# Patient Record
Sex: Female | Born: 1990 | Race: Black or African American | Hispanic: No | Marital: Single | Smoking: Former smoker
Health system: Southern US, Community
[De-identification: ages and names within clinical notes are randomized; demographics above are authoritative.]

## PROBLEM LIST (undated history)

## (undated) ENCOUNTER — Inpatient Hospital Stay (HOSPITAL_COMMUNITY): Payer: Self-pay

## (undated) DIAGNOSIS — Z8619 Personal history of other infectious and parasitic diseases: Secondary | ICD-10-CM

## (undated) DIAGNOSIS — B009 Herpesviral infection, unspecified: Secondary | ICD-10-CM

## (undated) HISTORY — DX: Personal history of other infectious and parasitic diseases: Z86.19

---
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
## 2010-02-08 ENCOUNTER — Ambulatory Visit: Payer: Self-pay | Admitting: Family Medicine

## 2010-02-08 LAB — CONVERTED CEMR LAB: Chlamydia, DNA Probe: NEGATIVE

## 2010-02-13 ENCOUNTER — Encounter: Payer: Self-pay | Admitting: Family Medicine
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
## 2010-05-29 NOTE — Letter (Signed)
Summary: LAB Letter  [facility]  [address]   [place], [place] [address]   Phone: [phone]  Fax: [phone]    02/13/2010  AEVA POSEY [address] [place], [place]  [address]  Dear Ms. FABRY,   Cervical cultures were negative. Good news!        Sincerely,   Denny Levy MD  Appended Document: LAB Letter mailed  Appended Document: LAB Letter letter returned, unable to forward. ERIN ODELL 02/28/10
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
## 2010-05-29 NOTE — Assessment & Plan Note (Signed)
Summary: resch'd from 10/6 for iud check/bmc   Vital Signs:  Patient profile:   20 year old female Height:      62 inches Weight:      139.4 pounds BMI:     25.59 Temp:     98.4 degrees F oral Pulse rate:   100 / minute BP sitting:   127 / 98  (left arm) Cuff size:   regular  Vitals Entered By: Garen Grams LPN (February 08, 2010 11:26 AM) CC: Check IUD, std check Is Patient Diabetic? No Pain Assessment Patient in pain? no        Primary Care Provider:  Lequita Asal  MD  CC:  Check IUD and std check.  History of Present Illness: d/c that is not itchy, smelly. last seveal days wants IUD check  Habits & Providers  Alcohol-Tobacco-Diet     Tobacco Status: current     Tobacco Counseling: to quit use of tobacco products     Cigarette Packs/Day: <0.25  Current Medications (verified): 1)  Ibuprofen 600 Mg Tabs (Ibuprofen) .... One Tab By Mouth Q6 As Needed Pain 2)  Triamcinolone Acetonide 0.1 % Oint (Triamcinolone Acetonide) .... Apply To Affected Area of Neck Two Times A Day Until Resolved. Disp 60 G Tube. 3)  Citalopram Hydrobromide 20 Mg Tabs (Citalopram Hydrobromide) .... One Tab By Mouth Daily 4)  Metronidazole 500 Mg Tabs (Metronidazole) .Marland Kitchen.. 1 By Mouth Two Times A Day  Allergies: No Known Drug Allergies  Past History:  Past Medical History: Last updated: 07/08/2008 h/o chlamydia GBS + abortion anemia tobacco abuse  Past Surgical History: Last updated: 07/08/2008 C section x2 (2006, 2007)  Review of Systems  The patient denies fever and abdominal pain.    Physical Exam  General:  alert and well-developed.   Genitalia:  normal introitus, no external lesions, and no vaginal discharge.  thin white d/c IUD strings are seen peeping slightkly from os--short but present   Impression & Recommendations:  Problem # 1:  VAGINITIS, BACTERIAL, RECURRENT (ICD-616.10)  Her updated medication list for this problem includes:    Metronidazole 500 Mg Tabs  (Metronidazole) .Marland Kitchen... 1 by mouth two times a day GC / Chlamydia p IUD strings seen (they are short but present)  Orders: [facility]- Est Level  3 ([address])  Complete Medication List: 1)  Ibuprofen 600 Mg Tabs (Ibuprofen) .... One tab by mouth q6 as needed pain 2)  Triamcinolone Acetonide 0.1 % Oint (Triamcinolone acetonide) .... Apply to affected area of neck two times a day until resolved. disp 60 g tube. 3)  Citalopram Hydrobromide 20 Mg Tabs (Citalopram hydrobromide) .... One tab by mouth daily 4)  Metronidazole 500 Mg Tabs (Metronidazole) .Marland Kitchen.. 1 by mouth two times a day  Other Orders: GC/Chlamydia-[facility] (87591/[address]) [facility] Prescriptions: METRONIDAZOLE 500 MG TABS (METRONIDAZOLE) 1 by mouth two times a day  #14 x 2   Entered and Authorized by:   Denny Levy MD   Signed by:   Denny Levy MD on 02/08/2010   Method used:   Electronically to        [facility] [address] [phone]* (retail)       [address]       [place], [place]  [address]       Ph: [phone]       Fax: [phone]   RxID:   [id number]   Laboratory Results  Date/Time Received: February 08, 2010 11:57 AM  Date/Time Reported: February 08, 2010 12:02  PM   Wet [place] Source: vag WBC/hpf: 10-20 Bacteria/hpf: 2+  Cocci Clue cells/hpf: moderate  Positive whiff Yeast/hpf: none Trichomonas/hpf: none Comments: ...............test performed by......Marland KitchenBonnie A. [place]az[place], MLS (ASCP)cm

## 2010-05-29 NOTE — Assessment & Plan Note (Signed)
Summary: depression,tcb   Vital Signs:  Patient profile:   20 year old female Height:      62 inches Weight:      138 pounds BMI:     25.33 Temp:     98.7 degrees F Pulse rate:   75 / minute BP sitting:   97 / 63  (right arm)  Vitals Entered By: Dennison Nancy RN (August 14, 2009 2:30 PM) CC: Depression Is Patient Diabetic? No Pain Assessment Patient in pain? no        Primary Care Provider:  Lequita Asal  MD  CC:  Depression.  History of Present Illness: 20 y/o female here c/o depression  depression- patient's therapist present. endorses depressive symptoms intermittently since birth of last son 09/2008. noted at postpartum visit, but were gone at that time. +crying spells, hypersomnolence, suicidal ideation without plan. no homicidal ideation. patient does contract for safety. denies problems with appetite. some anhedonia. seeing therapist at least 2x weekly through [facility]. increased stressors (son with new enuresis, youngest son now with FOB's family in [place]). patient has been missing some school 2/2 mood.   Habits & Providers  Alcohol-Tobacco-Diet     Tobacco Status: current     Tobacco Counseling: to quit use of tobacco products     Cigarette Packs/Day: <0.25  Allergies (verified): No Known Drug Allergies  Family History: father- heart disease parents- diabetes mother- substance use no other psych illness  Social History: Packs/Day:  <0.25  Physical Exam  General:  overweight female, NAD. vitals reviewed.  Psych:  Oriented X3, normally interactive, good eye contact, not anxious appearing, and not depressed appearing.     Impression & Recommendations:  Problem # 1:  DEPRESSION, MAJOR (ICD-296.20) Assessment New PHQ9- 20 patient to continue therapy. antidepressant medication tx discussed, and patient would like to pursue. potential side effects, including possibility of worsening depression or suicidal thoughts discussed. will start citalopram  20 mg by mouth daily. see patient  in 7-10 days to eval for side effects then in 4-6 weeks to decide if dose titration necessary. patient also encouraged to start exercising as that may help as well. patient again contracts for safety, and will notify PCP or therapist if thoughts of harming herself increase.   Orders: [facility]- Est Level  3 ([address])  Patient Instructions: 1)  Please schedule follow up in 7-10 days AND 4-6 weeks from NOW.  2)  Please schedule son LAPRECIOUS AUSTILL for well child check at same time as mom.  Prescriptions: CITALOPRAM HYDROBROMIDE 20 MG TABS (CITALOPRAM HYDROBROMIDE) one tab by mouth daily  #31 x 3   Entered and Authorized by:   Lequita Asal  MD   Signed by:   Lequita Asal  MD on 08/14/2009   Method used:   Electronically to        [facility] [address] [phone]* (retail)       [address]       [place], [place]  [address]       Ph: [phone]       Fax: [phone]   RxID:   [id number]    Vital Signs:  Patient profile:   20 year old female Height:      62 inches Weight:      138 pounds BMI:     25.33 Temp:     98.7 degrees F Pulse rate:   75 / minute BP sitting:   97 / 63  (right arm)  Vitals Entered By: Olegario Messier  Malen Gauze RN (August 14, 2009 2:30 PM)

## 2010-05-31 ENCOUNTER — Ambulatory Visit: Admit: 2010-05-31 | Payer: Self-pay

## 2010-05-31 ENCOUNTER — Ambulatory Visit: Payer: Self-pay | Admitting: Family Medicine

## 2010-06-05 ENCOUNTER — Encounter: Payer: Self-pay | Admitting: Family Medicine

## 2010-06-05 ENCOUNTER — Ambulatory Visit (INDEPENDENT_AMBULATORY_CARE_PROVIDER_SITE_OTHER): Payer: Medicaid Other | Admitting: Family Medicine

## 2010-06-05 DIAGNOSIS — L03119 Cellulitis of unspecified part of limb: Secondary | ICD-10-CM

## 2010-06-06 ENCOUNTER — Telehealth: Payer: Self-pay | Admitting: Family Medicine

## 2010-06-06 NOTE — Telephone Encounter (Signed)
Dermatology referral 

## 2010-06-07 ENCOUNTER — Encounter: Payer: Self-pay | Admitting: *Deleted

## 2010-06-14 NOTE — Assessment & Plan Note (Signed)
Summary: eo   Vital Signs:  Patient profile:   20 year old female Height:      62 inches Weight:      146.6 pounds BMI:     26.91 Temp:     98.6 degrees F oral Pulse rate:   89 / minute BP sitting:   115 / 68  (left arm) Cuff size:   regular  Vitals Entered By: Garen Grams LPN (June 05, 2010 10:02 AM) CC: STD check/boil on inner thigh. Is Patient Diabetic? No Pain Assessment Patient in pain? no        Primary Provider:  Lequita Asal  MD  CC:  STD check/boil on inner thigh..  History of Present Illness: 1. 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  She has a healing left inner thigh abscess with a healing ridge. Non-tender, no erythema, no warmth, no discharge, no fluctuance. She gets these periodically. I advised her to keep the area clean and dry and not to use a razor to shave the area, but to use a trimmer.  2. "Red bumps on perineum" she was concerned about STD causing "red bumps." what she pointed out was actually another soft tissue abscess. She had a small draining pustule. She was advised that this was the same bacterial process.  3. Concern for STD She originally was concerned for STD because of the "bumps" but after evidence of soft tissue, she decided against a vaginal exam and swab for GC/Chlamydia/Herpes/wet prep. This was offered to her.  Preventive Screening-Counseling & Management  Alcohol-Tobacco     Smoking Status: current     Packs/Day: <0.25     Tobacco Counseling: to quit use of tobacco products  Allergies: No Known Drug Allergies  Review of Systems       see HPI  Physical Exam  Skin:  left inner thigh 2x1 healing rdige and scarring from old drained abscess. small 0.5/0.5 cm pustule in the mons region.   Impression & Recommendations:  Problem # 1:  ABSCESS, INNER THIGH (ICD-682.6) She was advised that antibiotics were a temporizing solution to help this episode clear, but that these  abscesses come back frequently and good hygiene and not shaving can help.  The following medications were removed from the medication list:    Metronidazole 500 Mg Tabs (Metronidazole) .Marland Kitchen... 1 by mouth two times a day Her updated medication list for this problem includes:    Bactrim Ds 800-160 Mg Tabs (Sulfamethoxazole-trimethoprim) .Marland Kitchen... Take one tab every 12 hours by mouth for 5 days  Orders: [facility]- Est Level  3 ([address])  Complete Medication List: 1)  Ibuprofen 600 Mg Tabs (Ibuprofen) .... One tab by mouth q6 as needed pain 2)  Triamcinolone Acetonide 0.1 % Oint (Triamcinolone acetonide) .... Apply to affected area of neck two times a day until resolved. disp 60 g tube. 3)  Citalopram Hydrobromide 20 Mg Tabs (Citalopram hydrobromide) .... One tab by mouth daily 4)  Bactrim Ds 800-160 Mg Tabs (Sulfamethoxazole-trimethoprim) .... Take one tab every 12 hours by mouth  Patient Instructions: 1)  Please schedule a follow-up appointment as needed. Prescriptions: BACTRIM DS 800-160 MG TABS (SULFAMETHOXAZOLE-TRIMETHOPRIM) take one tab every 12 hours by mouth  #10 x 0   Entered and Authorized by:   Edd Arbour   Signed by:   Edd Arbour on 06/05/2010   Method used:   Electronically to        [facility] [address] [phone]* (retail)  [address]       [place], [place]  [address]       Ph: [phone]       Fax: [phone]   RxID:   [id number]    Orders Added: 1)  [facility]- Est Level  3 [[address]]

## 2010-08-05 LAB — CBC
HCT: 29 % — ABNORMAL LOW (ref 36.0–46.0)
Hemoglobin: 9.5 g/dL — ABNORMAL LOW (ref 12.0–15.0)
MCV: 77.3 fL — ABNORMAL LOW (ref 78.0–100.0)
Platelets: 276 10*3/uL (ref 150–400)
RDW: 17.1 % — ABNORMAL HIGH (ref 11.5–15.5)
WBC: 9.1 10*3/uL (ref 4.0–10.5)

## 2010-08-06 LAB — CBC
HCT: 25.1 % — ABNORMAL LOW (ref 36.0–49.0)
Hemoglobin: 8.3 g/dL — ABNORMAL LOW (ref 12.0–16.0)
Hemoglobin: 8.9 g/dL — ABNORMAL LOW (ref 12.0–16.0)
Platelets: 172 10*3/uL (ref 150–400)
Platelets: 171 10*3/uL (ref 150–400)
RBC: 3.24 MIL/uL — ABNORMAL LOW (ref 3.80–5.70)
RBC: 3.51 MIL/uL — ABNORMAL LOW (ref 3.80–5.70)
RDW: 15.4 % (ref 11.4–15.5)
WBC: 10.4 10*3/uL (ref 4.5–13.5)
WBC: 8.3 10*3/uL (ref 4.5–13.5)

## 2010-08-06 LAB — TYPE AND SCREEN: Antibody Screen: NEGATIVE

## 2010-08-09 LAB — GLUCOSE, CAPILLARY: Glucose-Capillary: 148 mg/dL — ABNORMAL HIGH (ref 70–99)

## 2010-08-14 LAB — URINALYSIS, ROUTINE W REFLEX MICROSCOPIC
Bilirubin Urine: NEGATIVE
Specific Gravity, Urine: 1.02 (ref 1.005–1.030)

## 2010-08-14 LAB — URINE MICROSCOPIC-ADD ON

## 2010-08-14 LAB — WET PREP, GENITAL
Trich, Wet Prep: NONE SEEN
Yeast Wet Prep HPF POC: NONE SEEN

## 2010-08-14 LAB — GC/CHLAMYDIA PROBE AMP, GENITAL
Chlamydia, DNA Probe: NEGATIVE
GC Probe Amp, Genital: NEGATIVE

## 2010-08-31 ENCOUNTER — Ambulatory Visit: Payer: Medicaid Other | Admitting: Family Medicine

## 2010-08-31 ENCOUNTER — Inpatient Hospital Stay (INDEPENDENT_AMBULATORY_CARE_PROVIDER_SITE_OTHER)
Admission: RE | Admit: 2010-08-31 | Discharge: 2010-08-31 | Disposition: A | Discharge status: Home / Self Care | Payer: Medicaid Other | Source: Ambulatory Visit | Attending: Emergency Medicine | Admitting: Emergency Medicine

## 2010-08-31 DIAGNOSIS — N390 Urinary tract infection, site not specified: Secondary | ICD-10-CM

## 2010-08-31 DIAGNOSIS — L02219 Cutaneous abscess of trunk, unspecified: Secondary | ICD-10-CM

## 2010-08-31 LAB — POCT URINALYSIS DIP (DEVICE)
Protein, ur: 30 mg/dL — AB
Specific Gravity, Urine: >=1.03 (ref 1.005–1.030)
pH: 5.5 (ref 5.0–8.0)

## 2010-08-31 LAB — POCT PREGNANCY, URINE: Preg Test, Ur: NEGATIVE

## 2010-09-04 LAB — CULTURE, ROUTINE-ABSCESS

## 2010-09-11 NOTE — Op Note (Signed)
NAMEMarland Kitchen  Andrea, Stokes               ACCOUNT NO.:  [id number]   MEDICAL RECORD NO.:  [id number]          PATIENT TYPE:  INP   LOCATION:  9134                          FACILITY:  [facility]   PHYSICIAN:  Horton Chin, MD DATE OF BIRTH:  04-Mar-1991   DATE OF PROCEDURE:  10/11/2008  DATE OF DISCHARGE:                               OPERATIVE REPORT   PREOPERATIVE DIAGNOSES:  1. Intrauterine pregnancy at 61 weeks' gestation.  2. Prior cesarean section x2.  3. Active labor.   POSTOPERATIVE DIAGNOSES:  1. Intrauterine pregnancy at 38 weeks' gestation.  2. Prior cesarean section x2.  3. Active labor.   PROCEDURE:  Repeat low transverse cesarean section via Pfannenstiel  incision.   SURGEON:  Horton Chin, MD   ASSISTANT:  Odie Sera, DO   ANESTHESIOLOGIST:  Belva Agee, MD   ANESTHESIA:  Spinal.   INDICATIONS:  The patient is a 20 year old gravida 4, para 2-0-1-2 at 50  weeks' gestation with a history of 2 prior cesarean sections who  presented in active labor.  The patient was noted to be 5 cm dilated  with a reactive fetal heart racing.  Given her presentation and active  labor, the patient was counseled regarding needing a cesarean section.  The risks of cesarean section were discussed with the patient including  but not limited to bleeding which might require transfusion, infection  which might require antibiotics, injury to surrounding organs, need for  additional procedures including hysterectomy in the event of a life-  threatening bleed, abnormal placentation in subsequent pregnancies and  written informed consent was obtained.   FINDINGS:  Viable female infant in cephalic presentation.  Apgars were 9  and 9, weight 7 pounds 14 ounces.  There was heavy meconium-stained  fluid.  There were dense adhesions of anterior uterus to omentum and  anterior abdominal wall.  The lower uterine segment was significantly  thin but it could be due to the patient being in labor  and having a  bulging bag of membranes.  There was an intact placenta with 3-vessel  cord.  Normal uterus, fallopian tubes, and ovaries bilaterally.   IV FLUIDS:  2 L of lactated Ringer's.   ESTIMATED BLOOD LOSS:  800 mL.   URINE OUTPUT:  225 mL.   SPECIMENS:  Placenta.   DISPOSITION OF SPECIMENS:  L&D.   COMPLICATIONS:  None immediate.   PROCEDURE DETAILS:  The patient had sequential compression devices  placed on bilateral lower extremities and received preoperative Ancef  prior to surgery.  She was then taken to the operating room where she  was placed in a dorsal supine position with a leftward tilt, and prepped  and draped in a sterile manner.  A Foley catheter was inserted into the  patient's bladder and attached to constant gravity.  Attention was then  turned to the patient's abdomen where a Pfannenstiel incision was made  over her preexisting incisional scar.  This incision was made with a  scalpel and carried through to the underlying layer of fascia.  The  fascia was incised in the midline, and this  incision was extended  bilaterally using Mayo scissors.  Kochers were applied to the superior  aspect of the fascial incision, and the underlying rectus muscles were  dissected bluntly and sharply.  The inferior aspect of the fascial  incision was also separated from the underlying rectus muscles. At this  point, there was noted to be dense adhesions of the omentum and the  anterior uterus to the anterior abdominal wall and these were carefully  taken down with a combination of blunt and sharp methods.  The  peritoneum was entered sharply and was extended superiorly and  anteriorly, giving good visualization of the uterus, bowel and bladder.  At this point, more adhesions were lysed and a bladder flap was created  during in the lysis of adhesions. A transverse hysterotomy was then made  in the lower uterine segment using a scalpel and extended bilaterally in  a blunt  fashion.  The amniotic fluid membranes were ruptured for thick  meconium stained fluid.  The infant's head was encountered and  delivered.  There was one loose nuchal cord that was easily reduced and  the rest of the infant delivered without complication.  The cord was  clamped and cut, and the infant was handed to the awaiting  neonatologists.  Fundal massage was administered and the placenta  delivered intact with 3-vessel cord.  The uterus was then cleared of all  clot and debris.  The hysterotomy was repaired using 0 Monocryl in a  running interlocking fashion.  A second layer of 0 Monocryl was used in  an imbricating layer.  Overall, good hemostasis was noted.  There were a  few areas on the serosa that were bleeding from the lysis of adhesions  and these were controlled using figure-of-eight stitches and  electrocautery.  The pelvis and gutters were cleared of all clot and  debris and hemostasis was reassured in all surfaces.  The peritoneum was  then reapproximated using 0 Monocryl in a running stitch.  The fascia  was reapproximated using 0 PDS in a running fashion, and the skin was  closed with 4-0 Vicryl subcuticular stitch.  The patient tolerated the  procedure well.  Sponge, instrument, and needle counts were correct x2.  She was taken to the recovery room in stable condition.      Horton Chin, MD  Electronically Signed     UAA/MEDQ  D:  10/11/2008  T:  10/12/2008  Job:  [id number]

## 2010-09-14 NOTE — Op Note (Signed)
Andrea Stokes, Andrea Stokes               ACCOUNT NO.:  [id number]   MEDICAL RECORD NO.:  [id number]          PATIENT TYPE:  INP   LOCATION:  9136                          FACILITY:  [facility]   PHYSICIAN:  Ginger Carne, MD  DATE OF BIRTH:  [birth year]   DATE OF PROCEDURE:  03/09/2006  DATE OF DISCHARGE:                                 OPERATIVE REPORT   PREOPERATIVE DIAGNOSIS:  Is term pregnancy [redacted] weeks gestation, previous  cesarean section in active labor, declining vaginal birth after cesarean  section.   POSTOP DIAGNOSIS:  Is term pregnancy [redacted] weeks gestation, previous cesarean  section in active labor, declining vaginal birth after cesarean section.  Term viable delivery of female infant.   PROCEDURE:  Repeat low transverse cesarean section.   SURGEON:  Ginger Carne, M.D.   ASSISTANT:  None.   COMPLICATIONS:  None immediate.   ESTIMATED BLOOD LOSS:  500 mL.   ANESTHESIA:  Spinal.   SPECIMEN:  Cord bloods to pathology.   OPERATIVE FINDINGS:  Term infant female delivered in vertex presentation.  Apgar and weight per delivery room record, no gross abnormalities.  Baby  cried spontaneously at delivery.  Amniotic fluid was clear.  Placenta was  complete, three-vessel cord, central insertion.  Uterus, tubes and ovaries  showed normal decidual changes of pregnancy.   OPERATIVE PROCEDURE:  The patient prepped and draped in usual fashion and  placed in left lateral supine position.  Betadine solution used for  antiseptic.  The patient catheterized prior to procedure.  After adequate  spinal analgesia a Pfannenstiel incision was made.  The abdomen opened.  Lower uterine segment incised transversely.  Baby delivered, cord clamped,  cut and infant given to the pediatric staff after bulb suctioning.  Placenta  removed manually.  Uterus inspected.  Closure of the uterine musculature in  one layer of 0 Vicryl running interlocking suture.  Bleeding points hemostatically checked.  Blood  clots removed.  Closure of  the fascia in one layer of 0 Vicryl suture and skin staples for the skin.  Instrument and sponge count were correct.  The patient tolerated the  procedure well, returned post anesthesia recovery room in excellent  condition.      Ginger Carne, MD  Electronically Signed     SHB/MEDQ  D:  03/09/2006  T:  03/09/2006  Job:  [id number]

## 2010-09-14 NOTE — Discharge Summary (Signed)
NAMEMarland Kitchen  Andrea Stokes, Andrea Stokes NO.:  [id number]   MEDICAL RECORD NO.:  [id number]           PATIENT TYPE:   LOCATION:                                 FACILITY:   PHYSICIAN:  Ruthe Mannan, M.D.            DATE OF BIRTH:   DATE OF ADMISSION:  03/09/2006  DATE OF DISCHARGE:  03/12/2006                                 DISCHARGE SUMMARY   REASON FOR ADMISSION:  The patient presented in labor.   DISCHARGE DIAGNOSES:  Labor, status post right low transverse cesarean  section with spinal anesthesia.   DISCHARGE MEDICATION:  1. Depo-Provera 150 mg IM x1 prior to discharge.  2. Calcium with vitamin D 1500 mg p.o. daily.   HOSPITAL COURSE:  The patient is a 20 year old, G2, P2-0-0-2 who presented  in labor at 49 weeks, March 09, 2006.  The patient was sent to C-section  for failure to progress.  She received spinal anesthesia with an estimated  blood loss of less than 500 mL.  This was a female baby with Apgars of 1 and  5.  Amniotic fluid was clear.  Mother was O-positive, rubella immune.  She  was GBS negative.  At admission, the patient thought that she was going to  give the baby up for adoption but after delivery the patient decided to keep  the baby.  She decided she did not want a circumcision.  She decided she  does want to bottle-feed.  She left with the staples intact and was given  instructions to return to clinic to have staples removed in 3-4 days.  She  also would like to have an IUD placed in 6 weeks.  We gave her Depo-Provera  150 mg IM prior to discharge.  We also supplemented that with calcium with  vitamin D 1500 mg daily.  The patient is stable and she is sleeping on  postop day #3.  Prior to discharge, we also filled out some forms for her to  get home-schooled for 6 weeks.  The patient is also sent home with ibuprofen  and prenatal vitamin and iron supplementation.           ______________________________  Ruthe Mannan, M.D.     TA/MEDQ  D:  03/12/2006   T:  03/12/2006  Job:  [id number]

## 2010-09-14 NOTE — Discharge Summary (Signed)
NAMEMarland Kitchen  AMRIT, ERCK               ACCOUNT NO.:  [id number]   MEDICAL RECORD NO.:  [id number]          PATIENT TYPE:  INP   LOCATION:  9134                          FACILITY:  [facility]   PHYSICIAN:  Horton Chin, MD DATE OF BIRTH:  1990/08/02   DATE OF ADMISSION:  10/11/2008  DATE OF DISCHARGE:  10/14/2008                               DISCHARGE SUMMARY   DISCHARGE DIAGNOSES:  1. Intrauterine pregnancy at 38 weeks.  2. History of 2 cesarean sections.  3. Iron deficiency anemia.  4. Late prenatal care.  5. Teenage pregnancy.  6. Group B Streptococcus carrier.   DISCHARGE MEDICATIONS:  1. Percocet 5/325 one tablet p.o. q.6 h. p.r.n. pain.  2. Ibuprofen 600 mg p.o. q.6 h. p.r.n. pain.  3. Ferrous sulfate 325 mg p.o. b.i.d.  4. Depo-Provera prior to discharge.   PROCEDURES:  Repeat low-transverse cesarean section on October 11, 2008.   HISTORY OF PRESENT ILLNESS:  1. Intrauterine pregnancy at 38 weeks:  The patient is 20 year old,      G4, P3-0-1-3, who presented to maternity admission on June 15 in      active labor at 38 weeks.  The patient has a history of 2 prior      cesarean sections and was scheduled for repeat cesarean section at      39 weeks.  The patient was 5 cm dilated with a reactive fetal heart      tracing on evaluation and was taken to the operating room.  The      patient went on to deliver a viable female infant, Apgars of 9 and 9,      weight of 7 pounds 14 ounces.  There was heavy meconium-stained      fluid and dense adhesions with anterior uterus to the omentum and      anterior abdominal wall.  Estimated blood loss was 800 mL.  The      patient has been tolerating the procedure well.  The patient was      taken to the postoperative recovery room.  The patient subsequently      had an uneventful postoperative course and was deemed stable for      discharge on postoperative day #3.  Her pain was well controlled on      Percocet and ibuprofen.  The patient had  flatus but no bowel      movement prior to discharge.  Her staples were removed prior to      discharge.  She is to be on pelvic rest x6 weeks and to do no heavy      lifting x6 weeks and is to follow up with the [facility] in 6 weeks.  2. Iron deficiency anemia:  The patient was noted to have a hemoglobin      of 8.9 upon arrival.  She had been previously diagnosed with anemia      during her pregnancy, but was not compliant with taking her iron      supplementation.  She was typed and screened in the operative room,  but did not receive any units of packed red blood cells.  The      patient's hemoglobin postoperatively was 8.3 and therefore, she was      started on ferrous sulfate supplementation while in the hospital.      She did not have any complaints of lightheadedness, dizziness,      chest pain, shortness of breath, or any other possible issues      related to her anemia.  She is to continue iron supplementation at      discharge.  3. Late prenatal care/teenage pregnancy/patient with 2 children:      Given multiple social issues, Social Work was consulted while the      patient was in the hospital.  The patient endorse having good      social support at home.  Father of the baby was present throughout      the hospital stay.  We will monitor for any issues as an      outpatient.   DISCHARGE INSTRUCTIONS:  The patient is to be on pelvic rest x6 weeks  and no heavy lifting x6 weeks.   FOLLOWUP:  The patient to follow up at [facility] on  July 22, at 2:20 p.m.      Lauro Franklin, MD      Horton Chin, MD  Electronically Signed    TCB/MEDQ  D:  10/16/2008  T:  10/16/2008  Job:  [id number]

## 2010-10-23 ENCOUNTER — Inpatient Hospital Stay (HOSPITAL_COMMUNITY)
Admission: AD | Admit: 2010-10-23 | Discharge: 2010-10-23 | Disposition: A | Discharge status: Home / Self Care | Payer: Medicaid Other | Source: Ambulatory Visit | Attending: Obstetrics and Gynecology | Admitting: Obstetrics and Gynecology

## 2010-10-23 DIAGNOSIS — N760 Acute vaginitis: Secondary | ICD-10-CM

## 2010-10-23 DIAGNOSIS — N949 Unspecified condition associated with female genital organs and menstrual cycle: Secondary | ICD-10-CM | POA: Insufficient documentation

## 2010-10-23 DIAGNOSIS — N898 Other specified noninflammatory disorders of vagina: Secondary | ICD-10-CM

## 2010-10-23 LAB — URINALYSIS, ROUTINE W REFLEX MICROSCOPIC
Bilirubin Urine: NEGATIVE
Ketones, ur: NEGATIVE mg/dL
Nitrite: NEGATIVE
pH: 5.5 (ref 5.0–8.0)

## 2010-10-23 LAB — WET PREP, GENITAL: Yeast Wet Prep HPF POC: NONE SEEN

## 2010-10-23 LAB — URINE MICROSCOPIC-ADD ON

## 2010-10-23 LAB — POCT PREGNANCY, URINE: Preg Test, Ur: NEGATIVE

## 2010-10-24 LAB — GC/CHLAMYDIA PROBE AMP, GENITAL: GC Probe Amp, Genital: NEGATIVE

## 2010-11-28 LAB — CULTURE, ROUTINE-ABSCESS

## 2010-12-12 ENCOUNTER — Encounter: Payer: Self-pay | Admitting: Family Medicine

## 2010-12-12 ENCOUNTER — Telehealth: Payer: Self-pay | Admitting: Family Medicine

## 2010-12-12 MED ORDER — DOXYCYCLINE HYCLATE 50 MG PO CAPS: 100.0000 mg | ORAL_CAPSULE | Freq: Two times a day (BID) | ORAL | Status: AC

## 2010-12-12 NOTE — Telephone Encounter (Signed)
Told patient about her positive GC culture. She is currently asymptomatic, culture was from a drained vaginal abscess. Will treat her with 7 days of Doxy 100mg  BID.  She understood.

## 2011-01-18 ENCOUNTER — Telehealth: Payer: Self-pay | Admitting: Family Medicine

## 2011-01-18 NOTE — Telephone Encounter (Signed)
Andrea Stokes would like to speak to someone because she said from her last visit she was being treated for an STD and she wants to know what her boyfriend needs to do to be treated.  I don't see where she has been to the office recently.

## 2011-01-21 NOTE — Telephone Encounter (Signed)
Spoke with patient and she was wanting to know where her boyfriend can be treated

## 2011-01-24 LAB — POCT URINALYSIS DIP (DEVICE)
Nitrite: NEGATIVE
Protein, ur: 30 — AB
pH: 6

## 2011-01-24 LAB — POCT PREGNANCY, URINE
Operator id: 235561
Preg Test, Ur: NEGATIVE

## 2011-01-24 LAB — WET PREP, GENITAL
WBC, Wet Prep HPF POC: NONE SEEN
Yeast Wet Prep HPF POC: NONE SEEN

## 2011-01-29 LAB — URINALYSIS, ROUTINE W REFLEX MICROSCOPIC
Ketones, ur: 15 — AB
Nitrite: NEGATIVE
Specific Gravity, Urine: 1.025
Urobilinogen, UA: 0.2
pH: 6.5

## 2011-01-29 LAB — POCT PREGNANCY, URINE: Preg Test, Ur: POSITIVE

## 2011-01-29 LAB — CBC
Hemoglobin: 10.4 — ABNORMAL LOW
MCHC: 31.6
RBC: 4.24
RDW: 14.7

## 2011-01-29 LAB — GC/CHLAMYDIA PROBE AMP, GENITAL
Chlamydia, DNA Probe: NEGATIVE
GC Probe Amp, Genital: NEGATIVE

## 2011-01-29 LAB — HCG, QUANTITATIVE, PREGNANCY: hCG, Beta Chain, Quant, S: 82232 — ABNORMAL HIGH

## 2011-01-30 LAB — WET PREP, GENITAL: Yeast Wet Prep HPF POC: NONE SEEN

## 2011-01-30 LAB — GC/CHLAMYDIA PROBE AMP, GENITAL: Chlamydia, DNA Probe: NEGATIVE

## 2011-03-15 ENCOUNTER — Ambulatory Visit: Payer: Medicaid Other | Admitting: Family Medicine

## 2011-08-02 ENCOUNTER — Encounter: Payer: Medicaid Other | Admitting: Family Medicine

## 2011-08-12 ENCOUNTER — Ambulatory Visit (INDEPENDENT_AMBULATORY_CARE_PROVIDER_SITE_OTHER): Payer: Medicaid Other | Admitting: Family Medicine

## 2011-08-12 ENCOUNTER — Other Ambulatory Visit (HOSPITAL_COMMUNITY)
Admission: RE | Admit: 2011-08-12 | Discharge: 2011-08-12 | Disposition: A | Discharge status: Home / Self Care | Payer: Medicaid Other | Source: Ambulatory Visit | Attending: Family Medicine | Admitting: Family Medicine

## 2011-08-12 ENCOUNTER — Encounter: Payer: Self-pay | Admitting: Family Medicine

## 2011-08-12 VITALS — BP 102/70 | HR 76 | Temp 98.3°F | Ht 61.0 in | Wt 161.0 lb

## 2011-08-12 DIAGNOSIS — Z113 Encounter for screening for infections with a predominantly sexual mode of transmission: Secondary | ICD-10-CM | POA: Insufficient documentation

## 2011-08-12 DIAGNOSIS — N898 Other specified noninflammatory disorders of vagina: Secondary | ICD-10-CM

## 2011-08-12 DIAGNOSIS — Z202 Contact with and (suspected) exposure to infections with a predominantly sexual mode of transmission: Secondary | ICD-10-CM

## 2011-08-12 DIAGNOSIS — N760 Acute vaginitis: Secondary | ICD-10-CM

## 2011-08-12 LAB — POCT WET PREP (WET MOUNT)

## 2011-08-12 NOTE — Assessment & Plan Note (Signed)
Negative wet prep.  Awaiting gc/chalmydia results.

## 2011-08-12 NOTE — Progress Notes (Signed)
  Subjective:    Patient ID: Andrea Stokes, female    DOB: August 22, 1990, 21 y.o.   MRN: [id number]  HPI 21 yo here for evaluation for STD  Newly found out partner has been outside of relationship with other partners.  No known STD contacts. Notes vaginal discharge.  No abdominal pain, dysuria, fever.  I have reviewed patient's  PMH, FH, and Social history and Medications as related to this visit.   Review of Systems    see HPI Objective:   Physical Exam GEN: Alert & Oriented, No acute distress Pelvic Exam:        External: normal female genitalia without lesions or masses        Vagina: normal without lesions or masses        Cervix: normal without lesions or masses        Samples for Wet prep, GC/Chlamydia obtained         Assessment & Plan:

## 2011-08-12 NOTE — Patient Instructions (Signed)
Did tests for HIV, Syphillis, Gonorrhea, Chlamydia and trichomonas today  I will call you with any results that need to be terated  I will send you a letter if allr esults are normal

## 2011-08-13 ENCOUNTER — Encounter: Payer: Self-pay | Admitting: Family Medicine

## 2012-05-01 NOTE — Op Note (Unsigned)
ROOM NUMBER:                           [id number]  [id number]            SURGEON:                                Rolene Arbour, MD            DATE:                                   08/13/2004            PREOPERATIVE DIAGNOSIS:  Intrauterine pregnancy at term with cephalopelvic      disproportion and adolescent pregnancy.            POSTOPERATIVE DIAGNOSIS:  Intrauterine pregnancy at term with cephalopelvic      disproportion and adolescent pregnancy.            OPERATION:  Primary low transverse cesarean section.            ANESTHESIA:  Epidural.            ASSISTANT:  Glenice Bow, RN, FA            ESTIMATED BLOOD LOSS:   500 mL.            COMPLICATIONS:  None.            FINDINGS:  Delivery of a female child in an LOA presentation, weight of 7      pounds 15 ounces, Apgars 9 and 9.            PROCEDURE:  The patient was taken back to the operating room.  She was then      reinforced more epidural and then prepped and draped in the usual manner      for surgery.  She had already had a Foley catheter placed.            A Pfannenstiel incision was made.  This was carried down to the fascia.      The fascia was nicked and extended laterally.  The rectus muscles were      separated in the midline, peritoneum identified and opened.  Bladder blade      was positioned in place, and the bladder flap was created with the aid of a      knife.  A nick was made on the lower uterine segment and extended      laterally, curving upwards.  Amniotic fluid was clear.  Infant was      delivered in LOA presentation.  Cord was clamped, and infant was handed to      waiting pediatricians, Apgars of 9 and 9, weight of 7 pounds 15 ounces.            Placenta then was removed and the uterus then cleansed of all retained      products and blood.  The uterus was then closed in 2 layers using 0 Vicryl      suture.  Hemostasis was adequate.  Right and left adnexa were entirely      normal.  The rectus muscles were reapproximated in the midline with  3-0      Vicryl suture.  The fascia was closed with 0  Vicryl suture, and the skin      was closed with #4 subcuticular stitch.  The patient tolerated the      procedure well, was brought to the recovery room in stable condition.                                          ______________________________     Date Signed: __________      Rolene Arbour, MD            VWU:[id number]      Dictated:    08/13/2004  5:06 P      Transcribed: 08/14/2004  9:54 A      Job Number: [id number]      Document Number: [id number]            CC:  Rolene Arbour, MD

## 2012-05-05 ENCOUNTER — Encounter: Payer: Medicaid Other | Admitting: Family Medicine

## 2012-06-10 ENCOUNTER — Emergency Department (INDEPENDENT_AMBULATORY_CARE_PROVIDER_SITE_OTHER)
Admission: EM | Admit: 2012-06-10 | Discharge: 2012-06-10 | Disposition: A | Discharge status: Home / Self Care | Payer: Self-pay | Source: Home / Self Care | Attending: Emergency Medicine | Admitting: Emergency Medicine

## 2012-06-10 ENCOUNTER — Encounter (HOSPITAL_COMMUNITY): Payer: Self-pay | Admitting: Emergency Medicine

## 2012-06-10 DIAGNOSIS — N390 Urinary tract infection, site not specified: Secondary | ICD-10-CM

## 2012-06-10 DIAGNOSIS — M545 Low back pain: Secondary | ICD-10-CM

## 2012-06-10 LAB — POCT URINALYSIS DIP (DEVICE)
Bilirubin Urine: NEGATIVE
Nitrite: POSITIVE — AB
Specific Gravity, Urine: >=1.03 (ref 1.005–1.030)
Urobilinogen, UA: 0.2 mg/dL (ref 0.0–1.0)
pH: 6 (ref 5.0–8.0)

## 2012-06-10 MED ORDER — NITROFURANTOIN MONOHYD MACRO 100 MG PO CAPS: 100.0000 mg | ORAL_CAPSULE | Freq: Two times a day (BID) | ORAL | Status: AC

## 2012-06-10 MED ORDER — CEPHALEXIN 500 MG PO CAPS: 500.0000 mg | ORAL_CAPSULE | Freq: Three times a day (TID) | ORAL | Status: AC

## 2012-06-10 NOTE — ED Notes (Signed)
Pt c/o poss UTI x3 days Sx include: dysuria, abd/back pain/pressure, frequency and urgency Denies: hematuria, vag discharge, f/v/n/d Took ibuprofen today around 0730 for the pain  She is alert w/no signs of acute distress.

## 2012-06-10 NOTE — ED Provider Notes (Addendum)
History     CSN: [id number]  Arrival date & time 06/10/12  1027   First MD Initiated Contact with Patient 06/10/12 1118      Chief Complaint  Patient presents with  . Urinary Tract Infection    (Consider location/radiation/quality/duration/timing/severity/associated sxs/prior treatment) HPI Comments: Patient presents urgent care describing for the last 3 days she has been having increased pressure frequency and burning with urination. He has also been expressing low back pain. She denies any nausea vomiting fevers or flank pain or vaginal discharge or bleeding. She denies having had a recent urinary tract infection as far as she can recall.  Patient is a 22 y.o. female presenting with urinary tract infection. The history is provided by the patient.  Urinary Tract Infection This is a new problem. The current episode started more than 2 days ago. The problem occurs constantly. The problem has been gradually worsening. Exacerbated by: Urinating. Nothing relieves the symptoms. Treatments tried: Ibuprofen. The treatment provided no relief.    History reviewed. No pertinent past medical history.  Past Surgical History  Procedure Laterality Date  . Cesarean section      No family history on file.  History  Substance Use Topics  . Smoking status: Current Every Day Smoker -- 1.00 packs/day    Types: Cigarettes  . Smokeless tobacco: Not on file  . Alcohol Use: Yes    OB History   Grav Para Term Preterm Abortions TAB SAB Ect Mult Living                  Review of Systems  Constitutional: Negative for fever, chills, activity change, appetite change and fatigue.  Genitourinary: Positive for dysuria, urgency, frequency and difficulty urinating. Negative for flank pain, decreased urine volume, vaginal bleeding, genital sores and dyspareunia.    Allergies  Review of patient's allergies indicates no known allergies.  Home Medications   Current Outpatient Rx  Name  Route  Sig   Dispense  Refill  . ibuprofen (ADVIL,MOTRIN) 600 MG tablet   Oral   Take 600 mg by mouth every 6 (six) hours as needed. for pain          . cephALEXin (KEFLEX) 500 MG capsule   Oral   Take 1 capsule (500 mg total) by mouth 3 (three) times daily.   21 capsule   0   . citalopram (CELEXA) 20 MG tablet   Oral   Take 20 mg by mouth daily.           . nitrofurantoin, macrocrystal-monohydrate, (MACROBID) 100 MG capsule   Oral   Take 1 capsule (100 mg total) by mouth 2 (two) times daily.   6 capsule   0   . sulfamethoxazole-trimethoprim (BACTRIM DS,SEPTRA DS) 800-160 MG per tablet   Oral   Take 1 tablet by mouth every 12 (twelve) hours.           . triamcinolone (KENALOG) 0.1 % ointment   Topical   Apply topically 2 (two) times daily. to affected area of neck until resolved            BP 130/93  Pulse 70  Temp(Src) 97.8 F (36.6 C) (Oral)  Resp 16  SpO2 98%  Physical Exam  Nursing note and vitals reviewed. Constitutional: She appears well-developed and well-nourished.  Abdominal: Soft. She exhibits no distension and no mass. There is no hepatosplenomegaly. There is tenderness in the suprapubic area. There is no rigidity, no rebound, no guarding, no CVA tenderness, no  tenderness at McBurney's point and negative Murphy's sign.  Skin: No erythema.    ED Course  Procedures (including critical care time)  Labs Reviewed  POCT URINALYSIS DIP (DEVICE) - Abnormal; Notable for the following:    Ketones, ur TRACE (*)    Hgb urine dipstick MODERATE (*)    Protein, ur 100 (*)    Nitrite POSITIVE (*)    Leukocytes, UA MODERATE (*)    All other components within normal limits  POCT PREGNANCY, URINE   No results found.   1. Urinary tract infection   2. Low back pain       MDM  Uncomplicated urinary tract infection. Patient will be prescribed Keflex- to complete a course of 7 days. She has been otherwise about what symptoms to be watchful for eliciting need for a  recheck or followup with [place] or her primary care doctor. Patient understands and agrees with treatment plan and followup care as necessary.        Jimmie Molly, MD 06/10/12 1127  Jimmie Molly, MD 06/10/12 1135

## 2013-02-18 ENCOUNTER — Ambulatory Visit (INDEPENDENT_AMBULATORY_CARE_PROVIDER_SITE_OTHER): Payer: Medicaid Other | Admitting: Family Medicine

## 2013-02-18 DIAGNOSIS — Z0000 Encounter for general adult medical examination without abnormal findings: Secondary | ICD-10-CM

## 2013-02-18 NOTE — Progress Notes (Signed)
Patient ID: Andrea Stokes, female   DOB: 05/06/1990, 22 y.o.   MRN: [id number] [facility]

## 2013-04-08 ENCOUNTER — Ambulatory Visit: Payer: Medicaid Other | Admitting: Family Medicine

## 2013-04-09 ENCOUNTER — Ambulatory Visit: Payer: Medicaid Other | Admitting: Family Medicine

## 2013-06-28 ENCOUNTER — Ambulatory Visit (INDEPENDENT_AMBULATORY_CARE_PROVIDER_SITE_OTHER): Payer: Medicaid Other | Admitting: Family Medicine

## 2013-06-28 ENCOUNTER — Telehealth: Payer: Self-pay | Admitting: Family Medicine

## 2013-06-28 ENCOUNTER — Other Ambulatory Visit (HOSPITAL_COMMUNITY)
Admission: RE | Admit: 2013-06-28 | Discharge: 2013-06-28 | Disposition: A | Discharge status: Home / Self Care | Payer: Medicaid Other | Source: Ambulatory Visit | Attending: Family Medicine | Admitting: Family Medicine

## 2013-06-28 ENCOUNTER — Encounter: Payer: Self-pay | Admitting: Family Medicine

## 2013-06-28 VITALS — BP 124/83 | HR 79 | Temp 98.6°F | Wt 167.0 lb

## 2013-06-28 DIAGNOSIS — A5901 Trichomonal vulvovaginitis: Secondary | ICD-10-CM | POA: Insufficient documentation

## 2013-06-28 DIAGNOSIS — N898 Other specified noninflammatory disorders of vagina: Secondary | ICD-10-CM

## 2013-06-28 DIAGNOSIS — R300 Dysuria: Secondary | ICD-10-CM

## 2013-06-28 DIAGNOSIS — Z01419 Encounter for gynecological examination (general) (routine) without abnormal findings: Secondary | ICD-10-CM | POA: Insufficient documentation

## 2013-06-28 DIAGNOSIS — Z6831 Body mass index (BMI) 31.0-31.9, adult: Secondary | ICD-10-CM | POA: Insufficient documentation

## 2013-06-28 DIAGNOSIS — Z975 Presence of (intrauterine) contraceptive device: Secondary | ICD-10-CM | POA: Insufficient documentation

## 2013-06-28 DIAGNOSIS — IMO0001 Reserved for inherently not codable concepts without codable children: Secondary | ICD-10-CM

## 2013-06-28 DIAGNOSIS — Z113 Encounter for screening for infections with a predominantly sexual mode of transmission: Secondary | ICD-10-CM | POA: Insufficient documentation

## 2013-06-28 DIAGNOSIS — Z309 Encounter for contraceptive management, unspecified: Secondary | ICD-10-CM

## 2013-06-28 DIAGNOSIS — Z124 Encounter for screening for malignant neoplasm of cervix: Secondary | ICD-10-CM

## 2013-06-28 LAB — COMPREHENSIVE METABOLIC PANEL
ALBUMIN: 4.4 g/dL (ref 3.5–5.2)
ALK PHOS: 48 U/L (ref 39–117)
ALT: 13 U/L (ref 0–35)
AST: 14 U/L (ref 0–37)
BILIRUBIN TOTAL: 0.3 mg/dL (ref 0.2–1.2)
BUN: 11 mg/dL (ref 6–23)
CO2: 25 mEq/L (ref 19–32)
Calcium: 9.5 mg/dL (ref 8.4–10.5)
Chloride: 108 mEq/L (ref 96–112)
Creat: 0.75 mg/dL (ref 0.50–1.10)
Glucose, Bld: 84 mg/dL (ref 70–99)
POTASSIUM: 3.9 meq/L (ref 3.5–5.3)
SODIUM: 139 meq/L (ref 135–145)
TOTAL PROTEIN: 6.4 g/dL (ref 6.0–8.3)

## 2013-06-28 LAB — POCT URINALYSIS DIPSTICK
Bilirubin, UA: NEGATIVE
Blood, UA: NEGATIVE
GLUCOSE UA: NEGATIVE
Ketones, UA: NEGATIVE
LEUKOCYTES UA: NEGATIVE
Nitrite, UA: NEGATIVE
PROTEIN UA: NEGATIVE
UROBILINOGEN UA: 0.2
pH, UA: 6

## 2013-06-28 LAB — POCT WET PREP (WET MOUNT): CLUE CELLS WET PREP WHIFF POC: NEGATIVE

## 2013-06-28 LAB — LIPID PANEL
Cholesterol: 122 mg/dL (ref 0–200)
HDL: 33 mg/dL — ABNORMAL LOW (ref >39)
LDL CALC: 80 mg/dL (ref 0–99)
TRIGLYCERIDES: 46 mg/dL (ref <150)
Total CHOL/HDL Ratio: 3.7 Ratio
VLDL: 9 mg/dL (ref 0–40)

## 2013-06-28 LAB — POCT URINE PREGNANCY: Preg Test, Ur: NEGATIVE

## 2013-06-28 MED ORDER — FLUCONAZOLE 150 MG PO TABS: 150.0000 mg | ORAL_TABLET | Freq: Once | ORAL | Status: DC

## 2013-06-28 MED ORDER — METRONIDAZOLE 500 MG PO TABS: 500.0000 mg | ORAL_TABLET | Freq: Two times a day (BID) | ORAL | Status: DC

## 2013-06-28 MED ORDER — CEFTRIAXONE SODIUM 250 MG IJ SOLR
250.0000 mg | Freq: Once | INTRAMUSCULAR | Status: AC
  Administered 2013-06-28: 250 mg via INTRAMUSCULAR

## 2013-06-28 MED ORDER — AZITHROMYCIN 250 MG PO TABS
500.0000 mg | ORAL_TABLET | Freq: Once | ORAL | Status: AC
  Administered 2013-06-28: 500 mg via ORAL

## 2013-06-28 NOTE — Patient Instructions (Addendum)
Ms. Andrea Stokes,  Thank you for coming in today. I will be in touch with lab results. I have placed a referral to gynecology for your IUD removal. This can be done closer to June if you like.   A reasonable goal for you height would be 140-150# To achieve this goal please exercise most days of the week, 4x per week.   For your diet:  1. Make sure to eat breakfast, lunch and dinner (also may add one snack mid morning or mid afternoon).  2. Carbs: no more than 2 servings (30 gram/2oz) per meal and 1 serving per snack.  3. Exercise such that you sweat some and your heart rate goes up most days of the week.  4. Water, water, water   Dr. Armen PickupFunches

## 2013-06-28 NOTE — Assessment & Plan Note (Signed)
A: unable to visualize strings. Patient desires removal with replacement long term contraception, nexplanon most likely. P: Referral to gyn for mirena removal.

## 2013-06-28 NOTE — Assessment & Plan Note (Signed)
A:  Reviewed BMI and healthy goals including eating veggies, working out most days of the week. P: Goal weight 140-150# Check CMP (fasting CBG) and lipid panel

## 2013-06-28 NOTE — Progress Notes (Signed)
   Subjective:    Patient ID: Andrea Stokes, female   Andrea Stokes DOB: 04/25/1991, 23 y.o.   MRN: [id number]  HPI 23 yo F presents for f/u visit for a physical and to discuss the following:  1. Urinary hesitancy: x two weeks. Feeling of urgency then when she goes to the restroom nothing passes. No dysuria, pelvic pain, N/V/Fever or flank pain. She is sexually active. Does not use condoms.   2. Vaginal discharge: x one week. Manson PasseyBrown. She is concerned about STD bc she does not use condoms. Would like to be checked and treated for GC/chlam today.   3. Mirena: placed at last postpartum f/u in 09/2008. She is interested in having it removed when due and having either another IUD or nexplanon. Leaning toward nexplanon.  4. Obesity: does not exercise. Works nights. Mom and dad with history of diabetes. Would like to lose weight.   Soc hx: non smoker  Review of Systems As per HPI Denies: HA, CP, SOB, cough, N/V/D, constipation, skin changes     Objective:   Physical Exam BP 124/83  Pulse 79  Temp(Src) 98.6 F (37 C) (Oral)  Wt 167 lb (75.751 kg) General appearance: alert, cooperative and no distress Throat: lips, mucosa, and tongue normal; teeth and gums normal, torus palanitus and torus mandibularis noted  Neck: no adenopathy, no carotid bruit, no JVD, supple, symmetrical, trachea midline and thyroid not enlarged, symmetric, no tenderness/mass/nodules Lungs: clear to auscultation bilaterally Heart: regular rate and rhythm, S1, S2 normal, no murmur, click, rub or gallop Abdomen: obese, soft, non-tender; bowel sounds normal; no masses,  no organomegaly Pelvic: cervix normal in appearance, external genitalia normal, no adnexal masses or tenderness, no cervical motion tenderness, rectovaginal septum normal, uterus normal size, shape, and consistency and scant whitish-brown vaginal discharge. cannot visualize IUD strings.  Extremities: extremities normal, atraumatic, no cyanosis or edema Skin: Skin color,  texture, turgor normal. No rashes or lesions, noted healed, vertical scar over sternum     Assessment & Plan:

## 2013-06-28 NOTE — Telephone Encounter (Signed)
Called patient. Left VM. Received a call back that I called Andrea Stokes, so both #'s listed in chart are incorrect.  Will send letter.

## 2013-06-28 NOTE — Assessment & Plan Note (Addendum)
A: wet prep done due to complaint if d/c. Wet reveals trichomonas. Patient treated with rocephin and azithromycin in the office. Gc/chlam, HIV, RPR pending.  P: Patient called, unable to reach her, so I senwet prep done, GC/chlam from pap.t a letter.  Sent in flagyl and diflucan.

## 2013-06-29 LAB — HIV ANTIBODY (ROUTINE TESTING W REFLEX): HIV: NONREACTIVE

## 2013-06-29 LAB — CERVICOVAGINAL ANCILLARY ONLY
CHLAMYDIA, DNA PROBE: NEGATIVE
NEISSERIA GONORRHEA: NEGATIVE

## 2013-06-29 LAB — RPR

## 2013-06-30 ENCOUNTER — Encounter: Payer: Self-pay | Admitting: Family Medicine

## 2013-06-30 ENCOUNTER — Telehealth: Payer: Self-pay | Admitting: Family Medicine

## 2013-06-30 NOTE — Telephone Encounter (Signed)
Attempted to call patient unable to leave VM. Results letter sent.

## 2013-08-06 ENCOUNTER — Ambulatory Visit: Payer: Medicaid Other | Admitting: Obstetrics & Gynecology

## 2013-09-16 ENCOUNTER — Encounter: Payer: Medicaid Other | Admitting: Obstetrics & Gynecology

## 2014-02-11 ENCOUNTER — Ambulatory Visit: Payer: Medicaid Other | Admitting: Diagnostic Neuroimaging

## 2014-03-03 ENCOUNTER — Encounter: Payer: Self-pay | Admitting: Family Medicine

## 2014-03-03 ENCOUNTER — Ambulatory Visit (INDEPENDENT_AMBULATORY_CARE_PROVIDER_SITE_OTHER): Payer: Medicaid Other | Admitting: Family Medicine

## 2014-03-03 ENCOUNTER — Other Ambulatory Visit: Payer: Self-pay | Admitting: Family Medicine

## 2014-03-03 VITALS — BP 104/63 | HR 82 | Temp 97.6°F | Ht 61.0 in | Wt 171.6 lb

## 2014-03-03 DIAGNOSIS — Z975 Presence of (intrauterine) contraceptive device: Secondary | ICD-10-CM

## 2014-03-03 DIAGNOSIS — Z202 Contact with and (suspected) exposure to infections with a predominantly sexual mode of transmission: Secondary | ICD-10-CM | POA: Diagnosis not present

## 2014-03-03 DIAGNOSIS — Z538 Procedure and treatment not carried out for other reasons: Secondary | ICD-10-CM

## 2014-03-03 NOTE — Progress Notes (Signed)
Patient seen for IUD removal.  Was seen by PCP in March, couldn't see strings.  Also having discharge and has concern for STD.   IUD Removal  Patient was in the dorsal lithotomy position, normal external genitalia was noted.  A speculum was placed in the patient's vagina, normal discharge was noted, no lesions. The cervix was visualized, no lesions, no abnormal discharge.  No strings were seen.  Cytobrush was used to sweep the cervix, but was unsuccessful at grasping the strings.  The cervix was then dilated and IUD hook was attempted to grasp IUD, which was not successful.  At this point the patient requested to stop.  Will get US to confirm IUD placement.  If still in uterus, will schedule for hysteroscopy and removal.    GC/CT, wet prep, HIV obtained.  Dr Macon LargeAnyanwu saw the patient, should the patient need hysteroscopy.

## 2014-03-04 ENCOUNTER — Telehealth: Payer: Self-pay | Admitting: General Practice

## 2014-03-04 LAB — GC/CHLAMYDIA PROBE AMP
CT Probe RNA: NEGATIVE
GC PROBE AMP APTIMA: NEGATIVE

## 2014-03-04 LAB — WET PREP, GENITAL: Trich, Wet Prep: NONE SEEN

## 2014-03-04 LAB — HIV ANTIBODY (ROUTINE TESTING W REFLEX): HIV 1&2 Ab, 4th Generation: NONREACTIVE

## 2014-03-04 NOTE — Telephone Encounter (Signed)
Called patient and informed her of negative results. Patient verbalized understanding and had no other questions 

## 2014-03-04 NOTE — Telephone Encounter (Signed)
-----   Message from Levie HeritageJacob J Stinson, DO sent at 03/04/2014  7:57 AM EST ----- STD screening negative - please let pt know.

## 2014-03-08 ENCOUNTER — Telehealth: Payer: Self-pay | Admitting: *Deleted

## 2014-03-08 NOTE — Telephone Encounter (Signed)
Patient called for std results. Negative results given Patient had no further questions.

## 2014-03-09 ENCOUNTER — Other Ambulatory Visit: Payer: Self-pay | Admitting: Family Medicine

## 2014-03-09 DIAGNOSIS — Z538 Procedure and treatment not carried out for other reasons: Secondary | ICD-10-CM

## 2014-03-09 DIAGNOSIS — Z975 Presence of (intrauterine) contraceptive device: Principal | ICD-10-CM

## 2014-03-10 ENCOUNTER — Telehealth: Payer: Self-pay | Admitting: General Practice

## 2014-03-10 ENCOUNTER — Ambulatory Visit (HOSPITAL_COMMUNITY)
Admission: RE | Admit: 2014-03-10 | Discharge: 2014-03-10 | Disposition: A | Discharge status: Home / Self Care | Payer: Medicaid Other | Source: Ambulatory Visit | Attending: Family Medicine | Admitting: Family Medicine

## 2014-03-10 DIAGNOSIS — Z975 Presence of (intrauterine) contraceptive device: Secondary | ICD-10-CM

## 2014-03-10 DIAGNOSIS — Z538 Procedure and treatment not carried out for other reasons: Secondary | ICD-10-CM

## 2014-03-10 NOTE — Telephone Encounter (Signed)
Patient called and left message stating she had her ultrasound appt today and everything went well and IUD is in place, would like to schedule appt now. Spoke to Dr Adrian BlackwaterStinson, patient to have hysteroscopy for removal of IUD. Called patient and informed her of surgery for removal and that she will be receiving a letter in the mail with her surgery date in the next couple of weeks. Patient verbalized understanding and had no other questions

## 2014-04-08 ENCOUNTER — Encounter (HOSPITAL_COMMUNITY)
Admission: RE | Disposition: A | Discharge status: Home / Self Care | Payer: Self-pay | Source: Ambulatory Visit | Attending: Obstetrics & Gynecology

## 2014-04-08 ENCOUNTER — Ambulatory Visit (HOSPITAL_COMMUNITY): Payer: Medicaid Other | Admitting: Anesthesiology

## 2014-04-08 ENCOUNTER — Encounter (HOSPITAL_COMMUNITY): Payer: Self-pay

## 2014-04-08 ENCOUNTER — Ambulatory Visit (HOSPITAL_COMMUNITY)
Admission: RE | Admit: 2014-04-08 | Discharge: 2014-04-08 | Disposition: A | Discharge status: Home / Self Care | Payer: Medicaid Other | Source: Ambulatory Visit | Attending: Obstetrics & Gynecology | Admitting: Obstetrics & Gynecology

## 2014-04-08 DIAGNOSIS — Z30432 Encounter for removal of intrauterine contraceptive device: Secondary | ICD-10-CM | POA: Insufficient documentation

## 2014-04-08 DIAGNOSIS — F17200 Nicotine dependence, unspecified, uncomplicated: Secondary | ICD-10-CM | POA: Insufficient documentation

## 2014-04-08 DIAGNOSIS — T839XXA Unspecified complication of genitourinary prosthetic device, implant and graft, initial encounter: Secondary | ICD-10-CM | POA: Diagnosis present

## 2014-04-08 DIAGNOSIS — T8389XA Other specified complication of genitourinary prosthetic devices, implants and grafts, initial encounter: Secondary | ICD-10-CM | POA: Diagnosis not present

## 2014-04-08 DIAGNOSIS — T8389XD Other specified complication of genitourinary prosthetic devices, implants and grafts, subsequent encounter: Secondary | ICD-10-CM

## 2014-04-08 DIAGNOSIS — T839XXD Unspecified complication of genitourinary prosthetic device, implant and graft, subsequent encounter: Secondary | ICD-10-CM

## 2014-04-08 DIAGNOSIS — T839XXS Unspecified complication of genitourinary prosthetic device, implant and graft, sequela: Secondary | ICD-10-CM

## 2014-04-08 HISTORY — PX: IUD REMOVAL: SHX5392

## 2014-04-08 LAB — CBC
HCT: 36.6 % (ref 36.0–46.0)
Hemoglobin: 11.7 g/dL — ABNORMAL LOW (ref 12.0–15.0)
MCH: 24.1 pg — ABNORMAL LOW (ref 26.0–34.0)
MCHC: 32 g/dL (ref 30.0–36.0)
MCV: 75.5 fL — AB (ref 78.0–100.0)
Platelets: 312 10*3/uL (ref 150–400)
RBC: 4.85 MIL/uL (ref 3.87–5.11)
RDW: 14.6 % (ref 11.5–15.5)
WBC: 10.8 10*3/uL — AB (ref 4.0–10.5)

## 2014-04-08 LAB — PREGNANCY, URINE: PREG TEST UR: NEGATIVE

## 2014-04-08 SURGERY — REMOVAL, INTRAUTERINE DEVICE
Anesthesia: Monitor Anesthesia Care | Site: Vagina

## 2014-04-08 SURGERY — Surgical Case
Anesthesia: *Unknown

## 2014-04-08 MED ORDER — KETOROLAC TROMETHAMINE 30 MG/ML IJ SOLN
INTRAMUSCULAR | Status: DC | PRN
  Administered 2014-04-08: 30 mg via INTRAVENOUS

## 2014-04-08 MED ORDER — DEXAMETHASONE SODIUM PHOSPHATE 4 MG/ML IJ SOLN
INTRAMUSCULAR | Status: AC
  Filled 2014-04-08: qty 1

## 2014-04-08 MED ORDER — MIDAZOLAM HCL 2 MG/2ML IJ SOLN
INTRAMUSCULAR | Status: AC
  Filled 2014-04-08: qty 2

## 2014-04-08 MED ORDER — FENTANYL CITRATE 0.05 MG/ML IJ SOLN
INTRAMUSCULAR | Status: AC
  Filled 2014-04-08: qty 5

## 2014-04-08 MED ORDER — LIDOCAINE HCL (CARDIAC) 20 MG/ML IV SOLN
INTRAVENOUS | Status: AC
  Filled 2014-04-08: qty 5

## 2014-04-08 MED ORDER — MEPERIDINE HCL 25 MG/ML IJ SOLN: 6.2500 mg | INTRAMUSCULAR | Status: DC | PRN

## 2014-04-08 MED ORDER — PROMETHAZINE HCL 25 MG/ML IJ SOLN: 6.2500 mg | INTRAMUSCULAR | Status: DC | PRN

## 2014-04-08 MED ORDER — MIDAZOLAM HCL 2 MG/2ML IJ SOLN
INTRAMUSCULAR | Status: DC | PRN
  Administered 2014-04-08: 2 mg via INTRAVENOUS

## 2014-04-08 MED ORDER — LACTATED RINGERS IV SOLN
INTRAVENOUS | Status: DC
  Administered 2014-04-08: 125 mL/h via INTRAVENOUS

## 2014-04-08 MED ORDER — ONDANSETRON HCL 4 MG/2ML IJ SOLN
INTRAMUSCULAR | Status: DC | PRN
  Administered 2014-04-08: 4 mg via INTRAVENOUS

## 2014-04-08 MED ORDER — SCOPOLAMINE 1 MG/3DAYS TD PT72
MEDICATED_PATCH | TRANSDERMAL | Status: AC
  Administered 2014-04-08: 1.5 mg via TRANSDERMAL
  Filled 2014-04-08: qty 1

## 2014-04-08 MED ORDER — ONDANSETRON HCL 4 MG/2ML IJ SOLN
INTRAMUSCULAR | Status: AC
  Filled 2014-04-08: qty 2

## 2014-04-08 MED ORDER — KETOROLAC TROMETHAMINE 30 MG/ML IJ SOLN
INTRAMUSCULAR | Status: AC
  Filled 2014-04-08: qty 1

## 2014-04-08 MED ORDER — PROPOFOL 10 MG/ML IV EMUL
INTRAVENOUS | Status: AC
  Filled 2014-04-08: qty 20

## 2014-04-08 MED ORDER — KETOROLAC TROMETHAMINE 30 MG/ML IJ SOLN: 15.0000 mg | Freq: Once | INTRAMUSCULAR | Status: DC | PRN

## 2014-04-08 MED ORDER — LACTATED RINGERS IV SOLN
INTRAVENOUS | Status: DC
  Administered 2014-04-08: 09:00:00 via INTRAVENOUS

## 2014-04-08 MED ORDER — PROPOFOL INFUSION 10 MG/ML OPTIME
INTRAVENOUS | Status: DC | PRN
  Administered 2014-04-08: 90 ug/kg/min via INTRAVENOUS

## 2014-04-08 MED ORDER — SCOPOLAMINE 1 MG/3DAYS TD PT72
1.0000 | MEDICATED_PATCH | Freq: Once | TRANSDERMAL | Status: DC
  Administered 2014-04-08: 1.5 mg via TRANSDERMAL

## 2014-04-08 MED ORDER — LIDOCAINE HCL (CARDIAC) 20 MG/ML IV SOLN
INTRAVENOUS | Status: DC | PRN
  Administered 2014-04-08: 50 mg via INTRAVENOUS

## 2014-04-08 MED ORDER — FENTANYL CITRATE 0.05 MG/ML IJ SOLN
INTRAMUSCULAR | Status: DC | PRN
  Administered 2014-04-08: 50 ug via INTRAVENOUS
  Administered 2014-04-08 (×2): 25 ug via INTRAVENOUS

## 2014-04-08 MED ORDER — BUPIVACAINE HCL (PF) 0.5 % IJ SOLN
INTRAMUSCULAR | Status: AC
  Filled 2014-04-08: qty 30

## 2014-04-08 MED ORDER — FENTANYL CITRATE 0.05 MG/ML IJ SOLN: 25.0000 ug | INTRAMUSCULAR | Status: DC | PRN

## 2014-04-08 SURGICAL SUPPLY — 19 items
CANISTER SUCT 3000ML (MISCELLANEOUS) IMPLANT
CATH ROBINSON RED A/P 16FR (CATHETERS) ×4 IMPLANT
CLOTH BEACON ORANGE TIMEOUT ST (SAFETY) ×4 IMPLANT
CONTAINER PREFILL 10% NBF 60ML (FORM) IMPLANT
DRAPE SHEET LG 3/4 BI-LAMINATE (DRAPES) ×4 IMPLANT
GLOVE BIO SURGEON STRL SZ 6.5 (GLOVE) ×3 IMPLANT
GLOVE BIO SURGEONS STRL SZ 6.5 (GLOVE) ×1
GLOVE BIOGEL PI IND STRL 7.0 (GLOVE) ×2 IMPLANT
GLOVE BIOGEL PI INDICATOR 7.0 (GLOVE) ×2
GLOVE ECLIPSE 7.0 STRL STRAW (GLOVE) ×4 IMPLANT
GOWN STRL REUS W/TWL LRG LVL3 (GOWN DISPOSABLE) ×8 IMPLANT
LOOP ANGLED CUTTING 22FR (CUTTING LOOP) IMPLANT
NEEDLE SPNL 22GX3.5 QUINCKE BK (NEEDLE) IMPLANT
PACK VAGINAL MINOR WOMEN LF (CUSTOM PROCEDURE TRAY) ×4 IMPLANT
PAD OB MATERNITY 4.3X12.25 (PERSONAL CARE ITEMS) ×4 IMPLANT
TOWEL OR 17X24 6PK STRL BLUE (TOWEL DISPOSABLE) ×8 IMPLANT
TUBING AQUILEX INFLOW (TUBING) ×4 IMPLANT
TUBING AQUILEX OUTFLOW (TUBING) ×4 IMPLANT
WATER STERILE IRR 1000ML POUR (IV SOLUTION) ×4 IMPLANT

## 2014-04-08 NOTE — Op Note (Signed)
Andrea Stokes Jump PROCEDURE DATE: 04/08/2014  PREOPERATIVE DIAGNOSES: Embedded IUD, two failed attempts of in office removal POSTOPERATIVE DIAGNOSES: The same PROCEDURE: IUD Removal under anesthesia SURGEON:  Dr. Jaynie CollinsUgonna Anyanwu ANESTHESIOLOGIST:  Dr, Dana AllanAmy Cassidy  INDICATIONS: 23 y.o. G0 here for IUD removal under anesthesia secondary to two failed in office IUD removal attempts. The risks of surgery were discussed in detail with the patient including but not limited to: bleeding; infection which may require antibiotics; injury to uterus or surrounding organs which may involve bowel, bladder, ureters ; need for additional procedures including laparoscopy or laparotomy; thromboembolic phenomenon, surgical site problems and other postoperative/anesthesia complications. Written informed consent was obtained.    FINDINGS:  Small uterus, nulliparous cervix. IUD removed easily after prepping cervix.  ANESTHESIA:   MAC INTRAVENOUS FLUIDS:700  ml ESTIMATED BLOOD LOSS: 0 ml COMPLICATIONS: None immediate  PROCEDURE IN DETAIL:  The patient  was then taken to the operating room where MACl anesthesia was administered and was found to be adequate.  She was placed in the dorsal lithotomy position, and was prepped and draped in a sterile manner. After an adequate timeout was performed, attention was turned to her pelvis where a speculum was placed in the vagina. The nulliparous cervix was visualized, no lesions, no abnormal discharge.  The strings of the IUD were not visualized, so Kelly forceps were introduced into the lower uterine segment and the IUD was grasped and removed in its entirety.   The patient tolerated the procedure well and was taken to the recovery area awake and in stable condition.  The patient will be discharged to home as per PACU criteria.  Routine postoperative instructions given.  She will use condoms for contraception.   Jaynie CollinsUGONNA  ANYANWU, MD, FACOG Attending Obstetrician &  Gynecologist Center for [facility]

## 2014-04-08 NOTE — H&P (Signed)
Preoperative History and Physical  Andrea Stokes is a 23 y.o. G0 here for surgical management of embedded IUD, two failed attempts of in office removal.   No significant preoperative concerns.  Proposed surgery: Dilation of cervix, removal of IUD, possible hysteroscopy  History reviewed. No pertinent past medical history. Past Surgical History  Procedure Laterality Date  . Cesarean section     OB History    No data available    Patient denies any cervical dysplasia or STIs.  No current facility-administered medications on file prior to encounter.   No current outpatient prescriptions on file prior to encounter.   No Known Allergies Social History:   reports that she has been smoking Cigarettes.  She has been smoking about 1.00 pack per day. She does not have any smokeless tobacco history on file. She reports that she drinks alcohol. She reports that she does not use illicit drugs.  Family History  Problem Relation Age of Onset  . Diabetes Mother   . Diabetes Father     Review of Systems: Noncontributory  PHYSICAL EXAM: Blood pressure 124/82, pulse 83, temperature 98.4 F (36.9 C), temperature source Oral, resp. rate 16, height 5\' 1"  (1.549 m), weight 171 lb (77.565 kg), SpO2 98 %. General appearance - alert, well appearing, and in no distress Chest - clear to auscultation, no wheezes, rales or rhonchi, symmetric air entry Heart - normal rate and regular rhythm Abdomen - soft, nontender, nondistended, no masses or organomegaly Pelvic - examination not indicated Extremities - peripheral pulses normal, no pedal edema, no clubbing or cyanosis  Labs: No results found for this or any previous visit (from the past 336 hour(s)).  Imaging Studies: [place] Transvaginal Non-ob  03/10/2014   CLINICAL DATA:  Unsuccessful attempt at IUD removal  EXAM: TRANSABDOMINAL AND TRANSVAGINAL ULTRASOUND OF PELVIS  TECHNIQUE: Both transabdominal and transvaginal ultrasound examinations of the  pelvis were performed. Transabdominal technique was performed for global imaging of the pelvis including uterus, ovaries, adnexal regions, and pelvic cul-de-sac. It was necessary to proceed with endovaginal exam following the transabdominal exam to visualize the uterus, endometrium and ovaries.  COMPARISON:  None  FINDINGS: Uterus  Measurements: 9.2 x 4.4 x 5.6 cm. No fibroids or other mass visualized.  Endometrium  Thickness: 4 mm. The IUD is identified within the endometrium within the lower uterine segment. No focal abnormality visualized.  Right ovary  Measurements: 4 x 2.7 x 2.6 cm. Normal appearance/no adnexal mass.  Left ovary  Measurements: 2.3 x 1.6 x 1.9 cm. Normal appearance/no adnexal mass.  Other findings  No free fluid.  IMPRESSION: 1. Normal pelvic sonogram. 2. IUD identified within the endometrium in the area of the lower uterine segment.   Electronically Signed   By: Signa Kellaylor  Stroud M.D.   On: 03/10/2014 11:27   [place] Pelvis Complete  03/10/2014   CLINICAL DATA:  Unsuccessful attempt at IUD removal  EXAM: TRANSABDOMINAL AND TRANSVAGINAL ULTRASOUND OF PELVIS  TECHNIQUE: Both transabdominal and transvaginal ultrasound examinations of the pelvis were performed. Transabdominal technique was performed for global imaging of the pelvis including uterus, ovaries, adnexal regions, and pelvic cul-de-sac. It was necessary to proceed with endovaginal exam following the transabdominal exam to visualize the uterus, endometrium and ovaries.  COMPARISON:  None  FINDINGS: Uterus  Measurements: 9.2 x 4.4 x 5.6 cm. No fibroids or other mass visualized.  Endometrium  Thickness: 4 mm. The IUD is identified within the endometrium within the lower uterine segment. No focal abnormality visualized.  Right  ovary  Measurements: 4 x 2.7 x 2.6 cm. Normal appearance/no adnexal mass.  Left ovary  Measurements: 2.3 x 1.6 x 1.9 cm. Normal appearance/no adnexal mass.  Other findings  No free fluid.  IMPRESSION: 1. Normal pelvic  sonogram. 2. IUD identified within the endometrium in the area of the lower uterine segment.   Electronically Signed   By: Signa Kellaylor  Stroud M.D.   On: 03/10/2014 11:27    Assessment: Patient Active Problem List   Diagnosis Date Noted  . Complication of intrauterine device (IUD) 04/08/2014  . Trichomonas vaginalis (TV) infection 06/28/2013  . BMI 31.0-31.9,adult 06/28/2013    Plan: Patient will undergo surgical management with dilation of cervix, removal of IUD, possible hysteroscopy.   The risks of surgery were discussed in detail with the patient including but not limited to: bleeding; infection which may require antibiotics; injury to uterus or surrounding organs which may involve bowel, bladder, ureters ; need for additional procedures including laparoscopy or laparotomy; thromboembolic phenomenon, surgical site problems and other postoperative/anesthesia complications. Likelihood of success in alleviating the patient's condition was discussed. Routine postoperative instructions will be reviewed with the patient and her family in detail after surgery.  The patient concurred with the proposed plan, giving informed written consent for the surgery.  Patient has been NPO since last night she will remain NPO for procedure.  Anesthesia and OR aware.  To OR when ready.  Jaynie CollinsUGONNA  Hady Niemczyk, M.D. 04/08/2014 8:40 AM

## 2014-04-08 NOTE — Anesthesia Postprocedure Evaluation (Signed)
  Anesthesia Post-op Note  Anesthesia Post Note  Patient: Andrea MorinKanani P Coffield  Procedure(s) Performed: Procedure(s) (LRB): INTRAUTERINE DEVICE (IUD) REMOVAL (N/A)  Anesthesia type: MAC  Patient location: PACU  Post pain: Pain level controlled  Post assessment: Post-op Vital signs reviewed  Last Vitals:  Filed Vitals:   04/08/14 1102  BP: 122/81  Pulse: 71  Temp:   Resp:     Post vital signs: Reviewed  Level of consciousness: sedated  Complications: No apparent anesthesia complications

## 2014-04-08 NOTE — Discharge Instructions (Addendum)
Call if you have any concerns

## 2014-04-08 NOTE — Transfer of Care (Signed)
Immediate Anesthesia Transfer of Care Note  Patient: Andrea Stokes  Procedure(s) Performed: Procedure(s): INTRAUTERINE DEVICE (IUD) REMOVAL (N/A)  Patient Location: PACU  Anesthesia Type:MAC  Level of Consciousness: awake, alert , oriented and patient cooperative  Airway & Oxygen Therapy: Patient Spontanous Breathing  Post-op Assessment: Report given to PACU RN and Post -op Vital signs reviewed and stable  Post vital signs: Reviewed and stable  Complications: No apparent anesthesia complications

## 2014-04-08 NOTE — Anesthesia Preprocedure Evaluation (Addendum)
Anesthesia Evaluation  Patient identified by MRN, date of birth, ID band Patient awake    Reviewed: Allergy & Precautions, H&P , NPO status , Patient's Chart, lab work & pertinent test results, reviewed documented beta blocker date and time   History of Anesthesia Complications Negative for: history of anesthetic complications  Airway Mallampati: II  TM Distance: >3 FB Neck ROM: full    Dental  (+) Teeth Intact   Pulmonary Current Smoker,  breath sounds clear to auscultation  Pulmonary exam normal       Cardiovascular negative cardio ROS  Rhythm:regular Rate:Normal     Neuro/Psych negative neurological ROS  negative psych ROS   GI/Hepatic negative GI ROS, Neg liver ROS,   Endo/Other  negative endocrine ROSBMI 32.4  Renal/GU negative Renal ROS  Female GU complaint (retained IUD)     Musculoskeletal   Abdominal   Peds  Hematology negative hematology ROS (+)   Anesthesia Other Findings   Reproductive/Obstetrics negative OB ROS                            Anesthesia Physical Anesthesia Plan  ASA: II  Anesthesia Plan: MAC   Post-op Pain Management:    Induction:   Airway Management Planned:   Additional Equipment:   Intra-op Plan:   Post-operative Plan:   Informed Consent: I have reviewed the patients History and Physical, chart, labs and discussed the procedure including the risks, benefits and alternatives for the proposed anesthesia with the patient or authorized representative who has indicated his/her understanding and acceptance.   Dental Advisory Given  Plan Discussed with: CRNA and Surgeon  Anesthesia Plan Comments:        Anesthesia Quick Evaluation

## 2014-04-11 ENCOUNTER — Encounter (HOSPITAL_COMMUNITY): Payer: Self-pay | Admitting: Obstetrics & Gynecology

## 2014-04-14 ENCOUNTER — Encounter: Payer: Self-pay | Admitting: *Deleted

## 2014-08-17 ENCOUNTER — Encounter (HOSPITAL_COMMUNITY): Payer: Self-pay | Admitting: Vascular Surgery

## 2014-08-17 ENCOUNTER — Emergency Department (HOSPITAL_COMMUNITY): Payer: No Typology Code available for payment source

## 2014-08-17 ENCOUNTER — Emergency Department (HOSPITAL_COMMUNITY)
Admission: EM | Admit: 2014-08-17 | Discharge: 2014-08-17 | Disposition: A | Discharge status: Home / Self Care | Payer: No Typology Code available for payment source | Attending: Emergency Medicine | Admitting: Emergency Medicine

## 2014-08-17 DIAGNOSIS — S8991XA Unspecified injury of right lower leg, initial encounter: Secondary | ICD-10-CM | POA: Diagnosis not present

## 2014-08-17 DIAGNOSIS — Y998 Other external cause status: Secondary | ICD-10-CM | POA: Diagnosis not present

## 2014-08-17 DIAGNOSIS — S8992XA Unspecified injury of left lower leg, initial encounter: Secondary | ICD-10-CM | POA: Diagnosis not present

## 2014-08-17 DIAGNOSIS — R202 Paresthesia of skin: Secondary | ICD-10-CM | POA: Diagnosis not present

## 2014-08-17 DIAGNOSIS — Y9389 Activity, other specified: Secondary | ICD-10-CM | POA: Insufficient documentation

## 2014-08-17 DIAGNOSIS — Z720 Tobacco use: Secondary | ICD-10-CM | POA: Diagnosis not present

## 2014-08-17 DIAGNOSIS — S3992XA Unspecified injury of lower back, initial encounter: Secondary | ICD-10-CM | POA: Insufficient documentation

## 2014-08-17 DIAGNOSIS — S60811A Abrasion of right wrist, initial encounter: Secondary | ICD-10-CM | POA: Insufficient documentation

## 2014-08-17 DIAGNOSIS — Y92410 Unspecified street and highway as the place of occurrence of the external cause: Secondary | ICD-10-CM | POA: Insufficient documentation

## 2014-08-17 DIAGNOSIS — R52 Pain, unspecified: Secondary | ICD-10-CM

## 2014-08-17 LAB — POC URINE PREG, ED: Preg Test, Ur: NEGATIVE

## 2014-08-17 MED ORDER — HYDROCODONE-ACETAMINOPHEN 5-325 MG PO TABS: 1.0000 | ORAL_TABLET | Freq: Four times a day (QID) | ORAL | Status: DC | PRN

## 2014-08-17 MED ORDER — HYDROCODONE-ACETAMINOPHEN 5-325 MG PO TABS
2.0000 | ORAL_TABLET | Freq: Once | ORAL | Status: AC
  Administered 2014-08-17: 2 via ORAL
  Filled 2014-08-17: qty 2
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
## 2014-11-14 ENCOUNTER — Encounter (INDEPENDENT_AMBULATORY_CARE_PROVIDER_SITE_OTHER): Payer: Medicaid Other | Admitting: Ophthalmology

## 2014-12-15 ENCOUNTER — Encounter: Payer: Self-pay | Admitting: Neurology

## 2014-12-22 ENCOUNTER — Ambulatory Visit: Payer: Medicaid Other | Admitting: Neurology

## 2015-01-06 ENCOUNTER — Inpatient Hospital Stay (HOSPITAL_COMMUNITY)
Admission: AD | Admit: 2015-01-06 | Discharge: 2015-01-06 | Disposition: A | Discharge status: Home / Self Care | Payer: Medicaid Other | Source: Ambulatory Visit | Attending: Obstetrics & Gynecology | Admitting: Obstetrics & Gynecology

## 2015-01-06 ENCOUNTER — Encounter (HOSPITAL_COMMUNITY): Payer: Self-pay | Admitting: *Deleted

## 2015-01-06 DIAGNOSIS — K642 Third degree hemorrhoids: Secondary | ICD-10-CM

## 2015-01-06 DIAGNOSIS — Z3202 Encounter for pregnancy test, result negative: Secondary | ICD-10-CM | POA: Insufficient documentation

## 2015-01-06 LAB — POCT PREGNANCY, URINE: Preg Test, Ur: NEGATIVE

## 2015-01-06 MED ORDER — LIDOCAINE HCL 2 % EX GEL: 1.0000 "application " | CUTANEOUS | Status: DC | PRN

## 2015-01-06 MED ORDER — HYDROCORTISONE 2.5 % RE CREA: TOPICAL_CREAM | RECTAL | Status: AC

## 2015-01-06 NOTE — Discharge Instructions (Signed)

## 2015-01-06 NOTE — MAU Note (Signed)
Written and verbal d/c instructions given to pt by Judithe Modest RN

## 2015-01-06 NOTE — MAU Note (Addendum)
Pain around rectal area for couple wks. Feels like a bump there cover the anus. Boyfriend said it has pus coming from it. Last BM couple days ago and was normal. Never had anything like this before. Occ has bleeding from area when wipes

## 2015-01-06 NOTE — MAU Provider Note (Signed)
History     CSN: [id number]  Arrival date and time: 01/06/15 2033   First Provider Initiated Contact with Patient 01/06/15 2141      Chief Complaint  Patient presents with  . Rectal Pain   HPI DWAYNE BEGAY 24 y.o. G3P3000 presents to the MAU with c/o swelling in her rectum and a lot of pain.  Past Medical History  Diagnosis Date  . Medical history non-contributory     Past Surgical History  Procedure Laterality Date  . Cesarean section    . Iud removal N/A 04/08/2014    Procedure: INTRAUTERINE DEVICE (IUD) REMOVAL;  Surgeon: Tereso Newcomer, MD;  Location: [facility];  Service: Gynecology;  Laterality: N/A;    Family History  Problem Relation Age of Onset  . Diabetes Mother   . Diabetes Father     Social History  Substance Use Topics  . Smoking status: Former Smoker -- 1.00 packs/day    Types: Cigarettes  . Smokeless tobacco: None  . Alcohol Use: 0.0 oz/week    0 Standard drinks or equivalent per week     Comment: socially    Allergies: No Known Allergies  Prescriptions prior to admission  Medication Sig Dispense Refill Last Dose  . ibuprofen (ADVIL,MOTRIN) 600 MG tablet Take 600 mg by mouth every 6 (six) hours as needed for moderate pain.    01/06/2015 at 0900  . phenylephrine-shark liver oil-mineral oil-petrolatum (PREPARATION H) 0.25-3-14-71.9 % rectal ointment Place 1 application rectally 2 (two) times daily as needed for hemorrhoids.   01/06/2015 at Unknown time  . HYDROcodone-acetaminophen (NORCO) 5-325 MG per tablet Take 1-2 tablets by mouth every 6 (six) hours as needed for moderate pain or severe pain. (Patient not taking: Reported on 01/06/2015) 10 tablet 0 Not Taking at Unknown time    Review of Systems  Constitutional: Negative for fever.  Genitourinary:       Rectal swelling and pain  All other systems reviewed and are negative.  Physical Exam   Blood pressure 118/77, pulse 104, temperature 98.4 F (36.9 C), resp. rate 18, height 5' (1.524 m),  weight 75.388 kg (166 lb 3.2 oz), last menstrual period 11/28/2014.  Physical Exam  Nursing note and vitals reviewed. Constitutional: She is oriented to person, place, and time.  Cardiovascular: Normal rate.   Respiratory: Effort normal. No respiratory distress.  Genitourinary: Rectal exam shows external hemorrhoid and tenderness.  Neurological: She is alert and oriented to person, place, and time.  Skin: Skin is warm and dry.  Psychiatric: She has a normal mood and affect. Her behavior is normal. Judgment and thought content normal.    MAU Course  Procedures  MDM-    Medication List    STOP taking these medications        phenylephrine-shark liver oil-mineral oil-petrolatum 0.25-3-14-71.9 % rectal ointment  Commonly known as:  PREPARATION H      TAKE these medications        HYDROcodone-acetaminophen 5-325 MG per tablet  Commonly known as:  NORCO  Take 1-2 tablets by mouth every 6 (six) hours as needed for moderate pain or severe pain.     hydrocortisone 2.5 % rectal cream  Commonly known as:  ANUSOL-HC  Apply rectally 2 times daily     ibuprofen 600 MG tablet  Commonly known as:  ADVIL,MOTRIN  Take 600 mg by mouth every 6 (six) hours as needed for moderate pain.     lidocaine 2 % jelly  Commonly known as:  XYLOCAINE  Apply 1 application topically as needed. Apply as needed         Assessment and Plan  External Hemorhoid F/U with PCP Discharge to home  [employer] [place] 01/06/2015, 10:32 PM

## 2015-05-04 ENCOUNTER — Encounter: Payer: Medicaid Other | Admitting: Family Medicine

## 2016-03-31 ENCOUNTER — Inpatient Hospital Stay (HOSPITAL_COMMUNITY)
Admission: AD | Admit: 2016-03-31 | Discharge: 2016-03-31 | Disposition: A | Discharge status: Home / Self Care | Payer: Medicaid Other | Source: Ambulatory Visit | Attending: Family Medicine | Admitting: Family Medicine

## 2016-03-31 ENCOUNTER — Encounter (HOSPITAL_COMMUNITY): Payer: Self-pay | Admitting: *Deleted

## 2016-03-31 DIAGNOSIS — Z87891 Personal history of nicotine dependence: Secondary | ICD-10-CM | POA: Insufficient documentation

## 2016-03-31 DIAGNOSIS — B373 Candidiasis of vulva and vagina: Secondary | ICD-10-CM | POA: Insufficient documentation

## 2016-03-31 DIAGNOSIS — B3731 Acute candidiasis of vulva and vagina: Secondary | ICD-10-CM

## 2016-03-31 DIAGNOSIS — Z3202 Encounter for pregnancy test, result negative: Secondary | ICD-10-CM

## 2016-03-31 DIAGNOSIS — O98811 Other maternal infectious and parasitic diseases complicating pregnancy, first trimester: Secondary | ICD-10-CM

## 2016-03-31 DIAGNOSIS — Z833 Family history of diabetes mellitus: Secondary | ICD-10-CM | POA: Insufficient documentation

## 2016-03-31 LAB — URINALYSIS, ROUTINE W REFLEX MICROSCOPIC
Bilirubin Urine: NEGATIVE
Glucose, UA: NEGATIVE mg/dL
Hgb urine dipstick: NEGATIVE
Ketones, ur: NEGATIVE mg/dL
Leukocytes, UA: NEGATIVE
Nitrite: NEGATIVE
PROTEIN: NEGATIVE mg/dL
Specific Gravity, Urine: >1.03 — ABNORMAL HIGH (ref 1.005–1.030)
pH: 5.5 (ref 5.0–8.0)

## 2016-03-31 LAB — WET PREP, GENITAL
CLUE CELLS WET PREP: NONE SEEN
Sperm: NONE SEEN
Trich, Wet Prep: NONE SEEN

## 2016-03-31 LAB — POCT PREGNANCY, URINE: Preg Test, Ur: NEGATIVE

## 2016-03-31 MED ORDER — TRIAMCINOLONE ACETONIDE 0.1 % EX CREA: 1.0000 "application " | TOPICAL_CREAM | Freq: Two times a day (BID) | CUTANEOUS | 0 refills | Status: DC

## 2016-03-31 MED ORDER — FLUCONAZOLE 150 MG PO TABS
150.0000 mg | ORAL_TABLET | Freq: Once | ORAL | Status: AC
  Administered 2016-03-31: 150 mg via ORAL
  Filled 2016-03-31: qty 1

## 2016-03-31 MED ORDER — FLUCONAZOLE 150 MG PO TABS: 150.0000 mg | ORAL_TABLET | Freq: Once | ORAL | 0 refills | Status: AC

## 2016-03-31 NOTE — Discharge Instructions (Signed)

## 2016-03-31 NOTE — Progress Notes (Signed)
Written and verbal d/c instructions given and understanding voiced. 

## 2016-03-31 NOTE — MAU Provider Note (Signed)
History     CSN: [id number]  Arrival date and time: 03/31/16 [id number]   First Provider Initiated Contact with Patient 03/31/16 0254      Chief Complaint  Patient presents with  . Abdominal Cramping   HPI   Andrea Stokes is a 25 y.o. female G3P3000 here in MAU with abdominal cramping and vaginal pain. She had unprotected sex around Thanksgiving and took Plan B 3 days later. She is concerned about STD's and pregnancy today. She has had severe vaginal itching. She denies odorous discharge.   She has not taken anything for the pain or itching.   OB History    Gravida Para Term Preterm AB Living   3 3 3          SAB TAB Ectopic Multiple Live Births                  Past Medical History:  Diagnosis Date  . Medical history non-contributory     Past Surgical History:  Procedure Laterality Date  . CESAREAN SECTION    . IUD REMOVAL N/A 04/08/2014   Procedure: INTRAUTERINE DEVICE (IUD) REMOVAL;  Surgeon: Tereso NewcomerUgonna A Anyanwu, MD;  Location: [facility];  Service: Gynecology;  Laterality: N/A;    Family History  Problem Relation Age of Onset  . Diabetes Mother   . Diabetes Father     Social History  Substance Use Topics  . Smoking status: Former Smoker    Packs/day: 1.00    Types: Cigarettes  . Smokeless tobacco: Never Used  . Alcohol use 0.0 oz/week     Comment: socially    Allergies: No Known Allergies  Prescriptions Prior to Admission  Medication Sig Dispense Refill Last Dose  . HYDROcodone-acetaminophen (NORCO) 5-325 MG per tablet Take 1-2 tablets by mouth every 6 (six) hours as needed for moderate pain or severe pain. (Patient not taking: Reported on 01/06/2015) 10 tablet 0 Not Taking at Unknown time  . ibuprofen (ADVIL,MOTRIN) 600 MG tablet Take 600 mg by mouth every 6 (six) hours as needed for moderate pain.    01/06/2015 at 0900  . lidocaine (XYLOCAINE) 2 % jelly Apply 1 application topically as needed. Apply as needed 30 mL 0    Results for orders placed or performed  during the hospital encounter of 03/31/16 (from the past 48 hour(s))  Urinalysis, Routine w reflex microscopic (not at [facility])     Status: Abnormal   Collection Time: 03/31/16  3:02 AM  Result Value Ref Range   Color, Urine YELLOW YELLOW   APPearance CLEAR CLEAR   Specific Gravity, Urine >1.030 (H) 1.005 - 1.030   pH 5.5 5.0 - 8.0   Glucose, UA NEGATIVE NEGATIVE mg/dL   Hgb urine dipstick NEGATIVE NEGATIVE   Bilirubin Urine NEGATIVE NEGATIVE   Ketones, ur NEGATIVE NEGATIVE mg/dL   Protein, ur NEGATIVE NEGATIVE mg/dL   Nitrite NEGATIVE NEGATIVE   Leukocytes, UA NEGATIVE NEGATIVE    Comment: MICROSCOPIC NOT DONE ON URINES WITH NEGATIVE PROTEIN, BLOOD, LEUKOCYTES, NITRITE, OR GLUCOSE <1000 mg/dL.  Wet prep, genital     Status: Abnormal   Collection Time: 03/31/16  3:02 AM  Result Value Ref Range   Yeast Wet Prep HPF POC PRESENT (A) NONE SEEN   Trich, Wet Prep NONE SEEN NONE SEEN   Clue Cells Wet Prep HPF POC NONE SEEN NONE SEEN   WBC, Wet Prep HPF POC MODERATE (A) NONE SEEN    Comment: MODERATE BACTERIA SEEN   Sperm NONE SEEN  Pregnancy, urine POC     Status: None   Collection Time: 03/31/16  3:15 AM  Result Value Ref Range   Preg Test, Ur NEGATIVE NEGATIVE    Comment:        THE SENSITIVITY OF THIS METHODOLOGY IS >24 mIU/mL     Review of Systems  Constitutional: Negative for chills and fever.  Gastrointestinal: Positive for abdominal pain (bilateral lower abdominal pain ).  Genitourinary: Positive for dysuria. Negative for frequency and urgency.   Physical Exam   Blood pressure 133/74, pulse 67, temperature 98.2 F (36.8 C), resp. rate 18, height 5' (1.524 m), weight 167 lb (75.8 kg), last menstrual period 03/01/2016.  Physical Exam  Constitutional: She is oriented to person, place, and time. She appears well-developed and well-nourished. No distress.  HENT:  Head: Normocephalic.  Eyes: Pupils are equal, round, and reactive to light.  Respiratory: Effort normal.  GI:  Soft. She exhibits no distension. There is no tenderness.  Genitourinary:  Genitourinary Comments: Wet prep and GC collected without speculum.  Large amount of thick white discharge noted on vulva.   Musculoskeletal: Normal range of motion.  Neurological: She is alert and oriented to person, place, and time.  Skin: Skin is warm. She is not diaphoretic.  Psychiatric: Her behavior is normal.    MAU Course  Procedures  None  MDM  Wet prep and GC collected  UA Upt negative  Diflucan 150 mg PO   Assessment and Plan   A:  1. Yeast vaginitis   2. Encounter for pregnancy test with result negative     P:  Discharge home in stable condition  Return to MAU as needed, if symptoms worsen.  Follow up with PCP as needed Rx: Diflucan to take in 3 days, triamcinolone cream.    Duane LopeJennifer I Rasch, NP 04/01/2016 8:07 AM

## 2016-03-31 NOTE — MAU Note (Signed)
Few days ago I had unprotected sex. ON Thurs took plan B just in case. Since then I have been cramping and my vagina feels like it is gonna explode. Having bad vag itching and vagina feels swollen and irritated. Afraid may have STD. NO d/c

## 2016-04-01 LAB — GC/CHLAMYDIA PROBE AMP (~~LOC~~) NOT AT ARMC
CHLAMYDIA, DNA PROBE: POSITIVE — AB
NEISSERIA GONORRHEA: NEGATIVE

## 2016-04-02 ENCOUNTER — Telehealth (HOSPITAL_COMMUNITY): Payer: Self-pay | Admitting: *Deleted

## 2016-04-02 NOTE — Telephone Encounter (Signed)
Left message for pt. To call hospital to obtain results.  Pt. Positive for Chlamydia.  No treatment called in for pt. At this time.

## 2016-04-03 ENCOUNTER — Other Ambulatory Visit: Payer: Self-pay | Admitting: Obstetrics and Gynecology

## 2016-04-03 ENCOUNTER — Telehealth (HOSPITAL_COMMUNITY): Payer: Self-pay | Admitting: Obstetrics and Gynecology

## 2016-04-03 ENCOUNTER — Telehealth: Payer: Self-pay | Admitting: Family Medicine

## 2016-04-03 ENCOUNTER — Other Ambulatory Visit (HOSPITAL_COMMUNITY): Payer: Self-pay | Admitting: Obstetrics and Gynecology

## 2016-04-03 MED ORDER — AZITHROMYCIN 500 MG PO TABS: 1000.0000 mg | ORAL_TABLET | Freq: Once | ORAL | 0 refills | Status: AC

## 2016-04-03 NOTE — Telephone Encounter (Signed)
Contacted pt. And called in perscription to [facility] on [address] on 04/03/16.  Pt. Advised to refrain from intercourse for 2 weeks.

## 2016-04-03 NOTE — Telephone Encounter (Signed)
Contacted patient to make sure her STD cultures were discussed with her from the ED. She states that they contacted her earlier today and she plans to pick up the antibiotics later today. I encouraged her to make an appointment at the clinic for routine checkup.

## 2016-05-09 ENCOUNTER — Ambulatory Visit (INDEPENDENT_AMBULATORY_CARE_PROVIDER_SITE_OTHER): Payer: Self-pay | Admitting: Family Medicine

## 2016-05-09 ENCOUNTER — Other Ambulatory Visit (HOSPITAL_COMMUNITY)
Admission: RE | Admit: 2016-05-09 | Discharge: 2016-05-09 | Disposition: A | Discharge status: Home / Self Care | Payer: Medicaid Other | Source: Ambulatory Visit | Attending: Family Medicine | Admitting: Family Medicine

## 2016-05-09 ENCOUNTER — Encounter: Payer: Self-pay | Admitting: Family Medicine

## 2016-05-09 VITALS — BP 126/86 | HR 79 | Temp 98.7°F | Ht 60.0 in | Wt 169.0 lb

## 2016-05-09 DIAGNOSIS — Z202 Contact with and (suspected) exposure to infections with a predominantly sexual mode of transmission: Secondary | ICD-10-CM

## 2016-05-09 DIAGNOSIS — Z113 Encounter for screening for infections with a predominantly sexual mode of transmission: Secondary | ICD-10-CM | POA: Insufficient documentation

## 2016-05-09 DIAGNOSIS — A5909 Other urogenital trichomoniasis: Secondary | ICD-10-CM

## 2016-05-09 LAB — POCT WET PREP (WET MOUNT): Clue Cells Wet Prep Whiff POC: NEGATIVE

## 2016-05-09 LAB — POCT URINE PREGNANCY: PREG TEST UR: NEGATIVE

## 2016-05-09 MED ORDER — CEFTRIAXONE SODIUM 250 MG IJ SOLR
250.0000 mg | Freq: Once | INTRAMUSCULAR | Status: AC
  Administered 2016-05-09: 250 mg via INTRAMUSCULAR

## 2016-05-09 MED ORDER — LIDOCAINE 2 % EX GEL: 1.0000 "application " | CUTANEOUS | 0 refills | Status: DC | PRN

## 2016-05-09 MED ORDER — METRONIDAZOLE 500 MG PO TABS
2000.0000 mg | ORAL_TABLET | Freq: Once | ORAL | Status: AC
  Administered 2016-05-09: 2000 mg via ORAL

## 2016-05-09 MED ORDER — DOXYCYCLINE HYCLATE 100 MG PO TABS: 100.0000 mg | ORAL_TABLET | Freq: Two times a day (BID) | ORAL | 0 refills | Status: DC

## 2016-05-09 NOTE — Progress Notes (Signed)
Subjective: CC: vaginal pain NGE:[id number]:Andrea Stokes is a 26 y.o. female presenting to clinic today for same day appointment. PCP: Uvaldo RisingFLETKE, KYLE, J, MD Concerns today include:  1. Vaginal pain Patient seen in MAU on 12/3.  She was treated as a yeast vaginitis at that time.  GC/CT was collected.  She tested positive for CT but did not receive treatment until 05/07/16.  She notes persistent vaginal pain, citing pain with sitting.  She reports pain at areas that urine comes in contact with.  She denies unprotected sex since MAU visit.  Endorses abdominal cramping, subjective fevers, chills.  Denies nausea, vomiting.  She reports vaginal discharge that is greenish/ brown.  Patient's last menstrual period was 05/01/2016 (exact date).  Not currently using contraception.   No Known Allergies  Social Hx reviewed. MedHx, current medications and allergies reviewed.  Please see EMR. ROS: Per HPI  Objective: Office vital signs reviewed. BP 126/86 (BP Location: Left Arm, Patient Position: Sitting, Cuff Size: Large)   Pulse 79   Temp 98.7 F (37.1 C) (Oral)   Ht 5' (1.524 m)   Wt 169 lb (76.7 kg)   LMP 05/01/2016 (Exact Date)   SpO2 98%   BMI 33.01 kg/m   Physical Examination:  General: Awake, alert, well nourished, No acute distress GU: external vaginal tissue with a small pustule on the labia majora on the left near the introitus, cervix not visualized as patient could not tolerate SSE, no bleeding, mild cervical motion tenderness, no abdominal/ adnexal masses  Results for orders placed or performed in visit on 05/09/16 (from the past 24 hour(s))  POCT Wet Prep Mellody Drown(Wet [place])     Status: Abnormal   Collection Time: 05/09/16 10:47 AM  Result Value Ref Range   Source Wet Prep POC VAG    WBC, Wet Prep HPF POC 10-15    Bacteria Wet Prep HPF POC Many (A) Few   Clue Cells Wet Prep HPF POC Few (A) None   Clue Cells Wet Prep Whiff POC Negative Whiff    Yeast Wet Prep HPF POC None    Trichomonas Wet  Prep HPF POC Present (A) Absent  POCT urine pregnancy     Status: None   Collection Time: 05/09/16 10:47 AM  Result Value Ref Range   Preg Test, Ur Negative Negative    Assessment/ Plan: 26 y.o. female   1. Exposure to STD.  Patient with positive Chlamydia test in December, not treated for 1 month.  I have elected to treat her for PID.  I have concern that she may have been exposed to herpes given her labial tenderness on exam.  I have sent culture for this and provided topical lidocaine.  Will plan to send Valtrex in if she is positive for HSV2. - Cervicovaginal ancillary only - POCT Wet Prep [facility](Wet [place]) - POCT urine pregnancy - HIV antibody (with reflex) - RPR - cefTRIAXone (ROCEPHIN) injection 250 mg; Inject 250 mg into the muscle once. - Doxy 100mg  BID x14 days. - Lidocaine 2% apply to affected areas on vagina every 4 hours as needed for pain - Herpes simplex virus culture - Abstain from intercourse for next month - Have partners treated - Consider pelvic ultrasound if persistent symptoms - Patient to follow up in 1 week with PCP  2. Trichomonal cervicitis - 2 grams of Flagyl administered in office.   This patient was discussed with Dr Pollie MeyerMcIntyre, who agrees with my assessment and plan.    Raliegh IpAshly M Gottschalk,  DO PGY-3, [facility]

## 2016-05-09 NOTE — Patient Instructions (Signed)
I will contact you will the results of your labs.  If anything is abnormal, I will call you.  Otherwise, expect a copy to be mailed to you.  Have your partners treated.  Do not have intercourse for the next month.  Plan to follow up with Dr Randolm IdolFletke in 1 week to check in.   Pelvic Inflammatory Disease Introduction Pelvic inflammatory disease (PID) is an infection in some or all of the female organs. PID can be in the uterus, ovaries, fallopian tubes, or the surrounding tissues that are inside the lower belly area (pelvis). PID can lead to lasting problems if it is not treated. To check for this disease, your doctor may:  Do a physical exam.  Do blood tests, urine tests, or a pregnancy test.  Look at your vaginal discharge.  Do tests to look inside the pelvis.  Test you for other infections. Follow these instructions at home:  Take over-the-counter and prescription medicines only as told by your doctor.  If you were prescribed an antibiotic medicine, take it as told by your doctor. Do not stop taking it even if you start to feel better.  Do not have sex until treatment is done or as told by your doctor.  Tell your sex partner if you have PID. Your partner may need to be treated.  Keep all follow-up visits as told by your doctor. This is important.  Your doctor may test you for infection again 3 months after you are treated. Contact a doctor if:  You have more fluid (discharge) coming from your vagina or fluid that is not normal.  Your pain does not improve.  You throw up (vomit).  You have a fever.  You cannot take your medicines.  Your partner has a sexually transmitted disease (STD).  You have pain when you pee (urinate). Get help right away if:  You have more belly (abdominal) or lower belly pain.  You have chills.  You are not better after 72 hours. This information is not intended to replace advice given to you by your health care provider. Make sure you discuss  any questions you have with your health care provider. Document Released: 07/12/2008 Document Revised: 09/21/2015 Document Reviewed: 05/23/2014  2017 Elsevier

## 2016-05-10 ENCOUNTER — Telehealth: Payer: Self-pay | Admitting: Family Medicine

## 2016-05-10 LAB — CERVICOVAGINAL ANCILLARY ONLY
Chlamydia: POSITIVE — AB
Neisseria Gonorrhea: NEGATIVE
Trichomonas: POSITIVE — AB

## 2016-05-10 LAB — RPR

## 2016-05-10 LAB — HIV ANTIBODY (ROUTINE TESTING W REFLEX): HIV: NONREACTIVE

## 2016-05-10 NOTE — Telephone Encounter (Signed)
Called patient and no answer. Left message for her to call back. If she calls please inform her of negative HIV and syphilis. Maryjean Mornempestt S Shannyn Jankowiak, CMA

## 2016-05-10 NOTE — Telephone Encounter (Signed)
Attempted to call re: negative syphilis and HIV testing.  If she returns my call, please let her know that these were negative.  Jordi Kamm M. Nadine CountsGottschalk, DO PGY-3, [facility]

## 2016-05-13 LAB — HERPES SIMPLEX VIRUS CULTURE: Organism ID, Bacteria: DETECTED

## 2016-05-13 NOTE — Telephone Encounter (Signed)
Spoke with patient and relayed the results. She expressed understanding and had no other questions. Maryjean Mornempestt S Roberts, CMA

## 2016-05-14 ENCOUNTER — Other Ambulatory Visit: Payer: Self-pay | Admitting: Family Medicine

## 2016-05-14 DIAGNOSIS — A6000 Herpesviral infection of urogenital system, unspecified: Secondary | ICD-10-CM | POA: Insufficient documentation

## 2016-05-14 MED ORDER — VALACYCLOVIR HCL 1 G PO TABS: 1000.0000 mg | ORAL_TABLET | Freq: Two times a day (BID) | ORAL | 0 refills | Status: DC

## 2016-05-14 NOTE — Progress Notes (Signed)
Reviewed positive HSV2 culture with patient.  Valtrex called in.  Recommend that she follow up with PCP for recurrent outbreaks.  All questions answered.  Meds ordered this encounter  Medications  . valACYclovir (VALTREX) 1000 MG tablet    Sig: Take 1 tablet (1,000 mg total) by mouth 2 (two) times daily. X 10 days    Dispense:  20 tablet    Refill:  0   Ashly M. Nadine CountsGottschalk, DO PGY-3, [facility]

## 2016-05-17 ENCOUNTER — Telehealth: Payer: Self-pay | Admitting: *Deleted

## 2016-05-17 NOTE — Telephone Encounter (Signed)
Form faxed to [facility] Andrea Stokes, Andrea Stokes D, [place]

## 2016-09-12 ENCOUNTER — Inpatient Hospital Stay (HOSPITAL_COMMUNITY)
Admission: AD | Admit: 2016-09-12 | Discharge: 2016-09-12 | Disposition: A | Discharge status: Home / Self Care | Payer: Medicaid Other | Source: Ambulatory Visit | Attending: Obstetrics & Gynecology | Admitting: Obstetrics & Gynecology

## 2016-09-12 ENCOUNTER — Encounter (HOSPITAL_COMMUNITY): Payer: Self-pay

## 2016-09-12 DIAGNOSIS — Z9889 Other specified postprocedural states: Secondary | ICD-10-CM | POA: Insufficient documentation

## 2016-09-12 DIAGNOSIS — Z79899 Other long term (current) drug therapy: Secondary | ICD-10-CM | POA: Insufficient documentation

## 2016-09-12 DIAGNOSIS — K6289 Other specified diseases of anus and rectum: Secondary | ICD-10-CM | POA: Insufficient documentation

## 2016-09-12 DIAGNOSIS — K610 Anal abscess: Secondary | ICD-10-CM | POA: Insufficient documentation

## 2016-09-12 DIAGNOSIS — F17210 Nicotine dependence, cigarettes, uncomplicated: Secondary | ICD-10-CM | POA: Insufficient documentation

## 2016-09-12 DIAGNOSIS — Z833 Family history of diabetes mellitus: Secondary | ICD-10-CM | POA: Insufficient documentation

## 2016-09-12 HISTORY — DX: Personal history of other infectious and parasitic diseases: Z86.19

## 2016-09-12 HISTORY — DX: Herpesviral infection, unspecified: B00.9

## 2016-09-12 LAB — URINALYSIS, ROUTINE W REFLEX MICROSCOPIC
BILIRUBIN URINE: NEGATIVE
Glucose, UA: NEGATIVE mg/dL
Hgb urine dipstick: NEGATIVE
KETONES UR: NEGATIVE mg/dL
Leukocytes, UA: NEGATIVE
NITRITE: NEGATIVE
PH: 7 (ref 5.0–8.0)
Protein, ur: NEGATIVE mg/dL
Specific Gravity, Urine: 1.01 (ref 1.005–1.030)

## 2016-09-12 LAB — POCT PREGNANCY, URINE: Preg Test, Ur: NEGATIVE

## 2016-09-12 MED ORDER — DICLOXACILLIN SODIUM 500 MG PO CAPS: 500.0000 mg | ORAL_CAPSULE | Freq: Four times a day (QID) | ORAL | 0 refills | Status: DC

## 2016-09-12 NOTE — MAU Provider Note (Signed)
History     CSN: [id number]  Arrival date and time: 09/12/16 1011  First Provider Initiated Contact with Patient 09/12/16 1041      Chief Complaint  Patient presents with  . Rectal Pain   HPI  Andrea Stokes is a 26 y.o. female who presents for rectal pain. Symptoms began last night. Rates pain 9/10. Pain made worse with sitting and palpation. Initially thought she had a hemorrhoid. Has treated with preparation-H, topical lidocaine, & ibuprofen without relief. As of this morning was able to palpate a mass near her anus. Saw a minimal amount of pink spotting on toilet paper but otherwise no drainage. Denies fever/chills, abdominal pain, constipation, or vaginal discharge.  Patient diagnosed with HSV earlier this year, started taking valtrex yesterday in case this was results of an outbreak.   Past Medical History:  Diagnosis Date  . HSV-2 (herpes simplex virus 2) infection   . Hx of chlamydia infection     Past Surgical History:  Procedure Laterality Date  . CESAREAN SECTION    . IUD REMOVAL N/A 04/08/2014   Procedure: INTRAUTERINE DEVICE (IUD) REMOVAL;  Surgeon: Tereso Newcomer, MD;  Location: [facility];  Service: Gynecology;  Laterality: N/A;    Family History  Problem Relation Age of Onset  . Diabetes Mother   . Diabetes Father     Social History  Substance Use Topics  . Smoking status: Current Every Day Smoker    Packs/day: 0.50    Types: Cigarettes  . Smokeless tobacco: Never Used  . Alcohol use 0.0 oz/week     Comment: socially    Allergies: No Known Allergies  Prescriptions Prior to Admission  Medication Sig Dispense Refill Last Dose  . doxycycline (VIBRA-TABS) 100 MG tablet Take 1 tablet (100 mg total) by mouth 2 (two) times daily. x14 days 28 tablet 0   . HYDROcodone-acetaminophen (NORCO) 5-325 MG per tablet Take 1-2 tablets by mouth every 6 (six) hours as needed for moderate pain or severe pain. (Patient not taking: Reported on 01/06/2015) 10 tablet 0 Not  Taking at Unknown time  . ibuprofen (ADVIL,MOTRIN) 600 MG tablet Take 600 mg by mouth every 6 (six) hours as needed for moderate pain.    01/06/2015 at 0900  . lidocaine (XYLOCAINE) 2 % jelly Apply 1 application topically as needed. Apply as needed 30 mL 0   . Lidocaine 2 % GEL Apply 1 application topically every 4 (four) hours as needed. For vaginal pain. 1 Tube 0   . triamcinolone cream (KENALOG) 0.1 % Apply 1 application topically 2 (two) times daily. 30 g 0   . valACYclovir (VALTREX) 1000 MG tablet Take 1 tablet (1,000 mg total) by mouth 2 (two) times daily. X 10 days 20 tablet 0     Review of Systems  Constitutional: Negative.   Gastrointestinal: Positive for anal bleeding and rectal pain. Negative for abdominal pain, blood in stool, constipation, diarrhea, nausea and vomiting.  Genitourinary: Negative for vaginal bleeding and vaginal discharge.   Physical Exam   Blood pressure (!) 133/100, pulse 90, temperature 98.6 F (37 C), temperature source Oral, resp. rate 18.  Physical Exam  Nursing note and vitals reviewed. Constitutional: She is oriented to person, place, and time. She appears well-developed and well-nourished. She appears distressed.  HENT:  Head: Normocephalic and atraumatic.  Eyes: Conjunctivae are normal. Right eye exhibits no discharge. Left eye exhibits no discharge. No scleral icterus.  Neck: Normal range of motion.  Respiratory: Effort normal. No respiratory  distress.  Genitourinary: Rectal exam shows tenderness. Rectal exam shows no external hemorrhoid.     Neurological: She is alert and oriented to person, place, and time.  Skin: Skin is warm and dry. She is not diaphoretic.  Psychiatric: She has a normal mood and affect. Her behavior is normal. Judgment and thought content normal.    MAU Course  Procedures Results for orders placed or performed during the hospital encounter of 09/12/16 (from the past 24 hour(s))  Urinalysis, Routine w reflex microscopic  (not at [facility])     Status: None   Collection Time: 09/12/16 10:15 AM  Result Value Ref Range   Color, Urine YELLOW YELLOW   APPearance CLEAR CLEAR   Specific Gravity, Urine 1.010 1.005 - 1.030   pH 7.0 5.0 - 8.0   Glucose, UA NEGATIVE NEGATIVE mg/dL   Hgb urine dipstick NEGATIVE NEGATIVE   Bilirubin Urine NEGATIVE NEGATIVE   Ketones, ur NEGATIVE NEGATIVE mg/dL   Protein, ur NEGATIVE NEGATIVE mg/dL   Nitrite NEGATIVE NEGATIVE   Leukocytes, UA NEGATIVE NEGATIVE  Pregnancy, urine POC     Status: None   Collection Time: 09/12/16 10:54 AM  Result Value Ref Range   Preg Test, Ur NEGATIVE NEGATIVE    MDM UPT negative Dr. Sirena Riddle FullingHarraway-Smith called to patient's room to assess possible perianal abscess.  I&D performed by Dr. Geeta Dworkin FullingHarraway-Smith (see her note) Assessment and Plan  A; 1. Perianal abscess    P; Discharge home Rx dicloxacillin  [facility] -- needs follow up no later than Monday for reassessment & packing removal Keep area clean & dry, take ibuprofen for pain Go to ED for worsening symptoms or fever, or return to MAU for packing removal if can't get appt with [facility]  Judeth HornErin Marliss Buttacavoli 09/12/2016, 10:40 AM

## 2016-09-12 NOTE — Discharge Instructions (Signed)
Keep area dry & clean. Take ibuprofen (per bottle instructions) for discomfort. Go to Emergency Dept for worsening symptoms or development of fever (>100.4).    Perianal Abscess An abscess is an infected area that is filled with pus. A perianal abscess occurs in the perineum, which is the area between the anus and the scrotum in males and between the anus and the vagina in females. Perianal abscesses can vary in size. Without treatment, a perianal abscess can become larger and cause other problems. What are the causes? This condition is caused by:  Waste from damaged or dead tissue (debris) that plugs up glands in the perineum. When this happens, an abscess may form.  Infections of the perineum. What are the signs or symptoms? Common symptoms of this condition include:  Swelling and redness in the area of the abscess. The redness may go beyond the abscess and appear as a red streak on the skin.  Pain in the area of the abscess, including pain when sitting, walking, or passing stool. Other possible symptoms include:  A visible, painful lump, or a lump that can be felt when touched.  Bleeding or pus-like discharge from the area.  Fever.  General weakness. How is this diagnosed? This condition is diagnosed based on your medical history and a physical exam of the affected area.  This may involve examining the rectal area with a gloved hand (digital rectal exam).  Sometimes, the health care provider needs to look into the rectum using a probe or a scope.  For women, it may require a careful vaginal exam. How is this treated? Treatment for this condition may include:  Making a cut (incision) in the abscess to drain the pus. This can sometimes be done in your health care provider's office or an emergency department after you are given medicine to numb the area (local anesthetic).  Surgery to drain the abscess. This is for larger or deeper abscesses.  Antibiotic medicines, if there  is infection in the surrounding tissue (cellulitis).  Having gauze packed into the abscess to continue draining the area.  Frequent baths in warm water that is deep enough to cover your hips and buttocks (sitz baths). These help the wound heal and they make the abscess less likely to come back. Follow these instructions at home: Medicines   Take over-the-counter and prescription medicines for pain, fever, or discomfort only as told by your health care provider.  If you were prescribed an antibiotic medicine, use it as told by your health care provider. Do not stop using the antibiotic even if you start to feel better.  Do not drive or use heavy machinery while taking prescription pain medicine. Wound care    Keep the skin around the wound clean and dry. Avoid cleaning the area too much.  Avoid scratching the wound.  Avoid using colored or perfumed toilet papers.  Take a sitz bath 3-4 times a day and after bowel movements. This will help reduce pain and swelling.  If directed, apply ice to the injured area:  Put ice in a plastic bag.  Place a towel between your skin and the bag.  Leave the ice on for 20 minutes, 2-3 times a day.  Check your incision area every day for signs of infection. Check for:  More redness, swelling, or pain.  More fluid or blood.  Warmth.  Pus or a bad smell. Gauze   If gauze was used in the abscess, follow instructions from your health care provider about  removing or changing the gauze. It can usually be removed in 2-3 days.  Wash your hands with soap and water before you remove or change your gauze. If soap and water are not available, use hand sanitizer.  If one or more drains were placed in the abscess cavity, be careful not to pull at them. Your health care provider will tell you how long they need to remain in place. General instructions   Keep all follow-up visits as told by your health care provider. This is important. Contact a health  care provider if:  You have trouble passing stool or passing urine.  Your pain or swelling in the affected area does not seem to be getting better.  The gauze packing or the drains come out before the planned time. Get help right away if:  You have problems moving or using your legs.  You have severe or increasing pain.  Your swelling in the affected area suddenly gets worse.  You have a large increase in bleeding or passing of pus.  You have chills or a fever. This information is not intended to replace advice given to you by your health care provider. Make sure you discuss any questions you have with your health care provider. Document Released: 05/22/2006 Document Revised: 11/03/2015 Document Reviewed: 09/25/2015 Elsevier Interactive Patient Education  2017 [employer]

## 2016-09-12 NOTE — MAU Note (Signed)
Pt is having pain between her tailbone and rectum. States it feels like a growth. Rates pain 9.5/10.

## 2016-09-13 ENCOUNTER — Ambulatory Visit: Payer: Medicaid Other | Admitting: Family Medicine

## 2016-09-16 ENCOUNTER — Ambulatory Visit: Payer: Medicaid Other | Admitting: Student

## 2016-09-24 ENCOUNTER — Telehealth: Payer: Self-pay | Admitting: Family Medicine

## 2016-09-24 NOTE — Telephone Encounter (Signed)
RN staff - please call patient and inform her that if the wound heals with packing inside that this can lead to further infections. Please have her return to have this removed. If the packing already fell out and the wound is healed than she does NOT need to return.

## 2016-09-24 NOTE — Telephone Encounter (Signed)
Pt didn't return to have packing removed from an abcess. She wants to know what would happen it if heals before the packing is removed. Please advise

## 2016-09-25 NOTE — Telephone Encounter (Signed)
Left message on voicemail for patient to call back. 

## 2016-09-27 NOTE — Telephone Encounter (Signed)
Called again, left message for patient to call back. 

## 2016-10-15 ENCOUNTER — Inpatient Hospital Stay (HOSPITAL_COMMUNITY): Payer: Medicaid Other

## 2016-10-15 ENCOUNTER — Inpatient Hospital Stay (HOSPITAL_COMMUNITY)
Admission: AD | Admit: 2016-10-15 | Discharge: 2016-10-15 | Disposition: A | Discharge status: Home / Self Care | Payer: Medicaid Other | Source: Ambulatory Visit | Attending: Family Medicine | Admitting: Family Medicine

## 2016-10-15 ENCOUNTER — Encounter (HOSPITAL_COMMUNITY): Payer: Self-pay

## 2016-10-15 DIAGNOSIS — O209 Hemorrhage in early pregnancy, unspecified: Secondary | ICD-10-CM | POA: Diagnosis not present

## 2016-10-15 DIAGNOSIS — F17210 Nicotine dependence, cigarettes, uncomplicated: Secondary | ICD-10-CM | POA: Diagnosis not present

## 2016-10-15 DIAGNOSIS — O34219 Maternal care for unspecified type scar from previous cesarean delivery: Secondary | ICD-10-CM | POA: Insufficient documentation

## 2016-10-15 DIAGNOSIS — R109 Unspecified abdominal pain: Secondary | ICD-10-CM | POA: Diagnosis not present

## 2016-10-15 DIAGNOSIS — Z3A01 Less than 8 weeks gestation of pregnancy: Secondary | ICD-10-CM | POA: Insufficient documentation

## 2016-10-15 DIAGNOSIS — Z79899 Other long term (current) drug therapy: Secondary | ICD-10-CM | POA: Diagnosis not present

## 2016-10-15 DIAGNOSIS — O3680X0 Pregnancy with inconclusive fetal viability, not applicable or unspecified: Secondary | ICD-10-CM

## 2016-10-15 DIAGNOSIS — O9989 Other specified diseases and conditions complicating pregnancy, childbirth and the puerperium: Secondary | ICD-10-CM | POA: Diagnosis not present

## 2016-10-15 DIAGNOSIS — O99332 Smoking (tobacco) complicating pregnancy, second trimester: Secondary | ICD-10-CM | POA: Diagnosis not present

## 2016-10-15 LAB — WET PREP, GENITAL
Sperm: NONE SEEN
Trich, Wet Prep: NONE SEEN
Yeast Wet Prep HPF POC: NONE SEEN

## 2016-10-15 LAB — CBC
HCT: 38 % (ref 36.0–46.0)
HEMOGLOBIN: 12 g/dL (ref 12.0–15.0)
MCH: 23.8 pg — AB (ref 26.0–34.0)
MCHC: 31.6 g/dL (ref 30.0–36.0)
MCV: 75.4 fL — ABNORMAL LOW (ref 78.0–100.0)
Platelets: 332 10*3/uL (ref 150–400)
RBC: 5.04 MIL/uL (ref 3.87–5.11)
RDW: 16.2 % — AB (ref 11.5–15.5)
WBC: 12.7 10*3/uL — ABNORMAL HIGH (ref 4.0–10.5)

## 2016-10-15 LAB — URINALYSIS, ROUTINE W REFLEX MICROSCOPIC

## 2016-10-15 LAB — URINALYSIS, MICROSCOPIC (REFLEX)

## 2016-10-15 LAB — HCG, QUANTITATIVE, PREGNANCY: HCG, BETA CHAIN, QUANT, S: 966 m[IU]/mL — AB (ref <5)

## 2016-10-15 LAB — POCT PREGNANCY, URINE: Preg Test, Ur: POSITIVE — AB

## 2016-10-15 NOTE — MAU Note (Signed)
Pt states her LMP 09/02/16. Pt states she noticed her period was last and took a pregnancy test on Thursday that was positive. Pt states she started having some spotting yesterday that has turned into bleeding like a period today. Pt c/o abdominal cramping that started yesterday that is worse today.

## 2016-10-15 NOTE — Discharge Instructions (Signed)
Ectopic Pregnancy An ectopic pregnancy is when the fertilized egg attaches (implants) outside the uterus. Most ectopic pregnancies occur in one of the tubes where eggs travel from the ovary to the uterus (fallopian tubes), but the implanting can occur in other locations. In rare cases, ectopic pregnancies occur on the ovary, intestine, pelvis, abdomen, or cervix. In an ectopic pregnancy, the fertilized egg does not have the ability to develop into a normal, healthy baby. A ruptured ectopic pregnancy is one in which tearing or bursting of a fallopian tube causes internal bleeding. Often, there is intense lower abdominal pain, and vaginal bleeding sometimes occurs. Having an ectopic pregnancy can be life-threatening. If this dangerous condition is not treated, it can lead to blood loss, shock, or even death. What are the causes? The most common cause of this condition is damage to one of the fallopian tubes. A fallopian tube may be narrowed or blocked, and that keeps the fertilized egg from reaching the uterus. What increases the risk? This condition is more likely to develop in women of childbearing age who have different levels of risk. The levels of risk can be divided into three categories. High risk  You have gone through infertility treatment.  You have had an ectopic pregnancy before.  You have had surgery on the fallopian tubes, or another surgical procedure, such as an abortion.  You have had surgery to have the fallopian tubes tied (tubal ligation).  You have problems or diseases of the fallopian tubes.  You have been exposed to diethylstilbestrol (DES). This medicine was used until 1971, and it had effects on babies whose mothers took the medicine.  You become pregnant while using an IUD (intrauterine device) for birth control. Moderate risk  You have a history of infertility.  You have had an STI (sexually transmitted infection).  You have a history of pelvic inflammatory  disease (PID).  You have scarring from endometriosis.  You have multiple sexual partners.  You smoke. Low risk  You have had pelvic surgery.  You use vaginal douches.  You became sexually active before age 18. What are the signs or symptoms? Common symptoms of this condition include normal pregnancy symptoms, such as missing a period, nausea, tiredness, abdominal pain, breast tenderness, and bleeding. However, ectopic pregnancy will have additional symptoms, such as:  Pain with intercourse.  Irregular vaginal bleeding or spotting.  Cramping or pain on one side or in the lower abdomen.  Fast heartbeat, low blood pressure, and sweating.  Passing out while having a bowel movement.  Symptoms of a ruptured ectopic pregnancy and internal bleeding may include:  Sudden, severe pain in the abdomen and pelvis.  Dizziness, weakness, light-headedness, or fainting.  Pain in the shoulder or neck area.  How is this diagnosed? This condition is diagnosed by:  A pelvic exam to locate pain or a mass in the abdomen.  A pregnancy test. This blood test checks for the presence as well as the specific level of pregnancy hormone in the bloodstream.  Ultrasound. This is performed if a pregnancy test is positive. In this test, a probe is inserted into the vagina. The probe will detect a fetus, possibly in a location other than the uterus.  Taking a sample of uterus tissue (dilation and curettage, or D&C).  Surgery to perform a visual exam of the inside of the abdomen using a thin, lighted tube that has a tiny camera on the end (laparoscope).  Culdocentesis. This procedure involves inserting a needle at the top   of the vagina, behind the uterus. If blood is present in this area, it may indicate that a fallopian tube is torn.  How is this treated? This condition is treated with medicine or surgery. Medicine  An injection of a medicine (methotrexate) may be given to cause the pregnancy tissue  to be absorbed. This medicine may save your fallopian tube. It may be given if: ? The diagnosis is made early, with no signs of active bleeding. ? The fallopian tube has not ruptured. ? You are considered to be a good candidate for the medicine. Usually, pregnancy hormone blood levels are checked after methotrexate treatment. This is to be sure that the medicine is effective. It may take 4-6 weeks for the pregnancy to be absorbed. Most pregnancies will be absorbed by 3 weeks. Surgery  A laparoscope may be used to remove the pregnancy tissue.  If severe internal bleeding occurs, a larger cut (incision) may be made in the lower abdomen (laparotomy) to remove the fetus and placenta. This is done to stop the bleeding.  Part or all of the fallopian tube may be removed (salpingectomy) along with the fetus and placenta. The fallopian tube may also be repaired during the surgery.  In very rare circumstances, removal of the uterus (hysterectomy) may be required.  After surgery, pregnancy hormone testing may be done to be sure that there is no pregnancy tissue left. Whether your treatment is medicine or surgery, you may receive a Rho (D) immune globulin shot to prevent problems with any future pregnancy. This shot may be given if:  You are Rh-negative and the baby's father is Rh-positive.  You are Rh-negative and you do not know the Rh type of the baby's father.  Follow these instructions at home:  Rest and limit your activity after the procedure for as long as told by your health care provider.  Until your health care provider says that it is safe: ? Do not lift anything that is heavier than 10 lb (4.5 kg), or the limit that your health care provider tells you. ? Avoid physical exercise and any movement that requires effort (is strenuous).  To help prevent constipation: ? Eat a healthy diet that includes fruits, vegetables, and whole grains. ? Drink 6-8 glasses of water per day. Get help  right away if:  You develop worsening pain that is not relieved by medicine.  You have: ? A fever or chills. ? Vaginal bleeding. ? Redness and swelling at the incision site. ? Nausea and vomiting.  You feel dizzy or weak.  You feel light-headed or you faint. This information is not intended to replace advice given to you by your health care provider. Make sure you discuss any questions you have with your health care provider. Document Released: 05/23/2004 Document Revised: 12/13/2015 Document Reviewed: 11/15/2015 Elsevier Interactive Patient Education  2018 [employer]  

## 2016-10-15 NOTE — MAU Provider Note (Signed)
History     CSN: [id number]  Arrival date and time: 10/15/16 2023   First Provider Initiated Contact with Patient 10/15/16 2110      Chief Complaint  Patient presents with  . Vaginal Bleeding   Vaginal Bleeding  The patient's primary symptoms include pelvic pain and vaginal bleeding. The patient's pertinent negatives include no vaginal discharge. This is a new problem. The current episode started yesterday. The problem occurs constantly. The problem has been gradually worsening. Pain severity now: 8/10. The problem affects both sides. She is pregnant. Associated symptoms include nausea. Pertinent negatives include no chills, dysuria, fever, frequency, urgency or vomiting. The vaginal bleeding is typical of menses. She has not been passing clots. She has not been passing tissue. Nothing aggravates the symptoms. She has tried nothing for the symptoms. Her menstrual history has been regular (LMP 09/02/16 ).     Past Medical History:  Diagnosis Date  . HSV-2 (herpes simplex virus 2) infection   . Hx of chlamydia infection     Past Surgical History:  Procedure Laterality Date  . CESAREAN SECTION    . IUD REMOVAL N/A 04/08/2014   Procedure: INTRAUTERINE DEVICE (IUD) REMOVAL;  Surgeon: Tereso Newcomer, MD;  Location: [facility];  Service: Gynecology;  Laterality: N/A;    Family History  Problem Relation Age of Onset  . Diabetes Mother   . Diabetes Father     Social History  Substance Use Topics  . Smoking status: Current Every Day Smoker    Packs/day: 0.50    Types: Cigarettes  . Smokeless tobacco: Never Used  . Alcohol use 0.0 oz/week     Comment: socially    Allergies: No Known Allergies  Prescriptions Prior to Admission  Medication Sig Dispense Refill Last Dose  . acetaminophen (TYLENOL) 325 MG tablet Take 650 mg by mouth every 6 (six) hours as needed for mild pain.   10/14/2016 at Unknown time  . dicloxacillin (DYNAPEN) 500 MG capsule Take 1 capsule (500 mg total) by mouth  every 6 (six) hours. (Patient not taking: Reported on 10/15/2016) 40 capsule 0 Not Taking at Unknown time  . Lidocaine 2 % GEL Apply 1 application topically every 4 (four) hours as needed. For vaginal pain. (Patient not taking: Reported on 10/15/2016) 1 Tube 0 Completed Course at Unknown time  . valACYclovir (VALTREX) 1000 MG tablet Take 1 tablet (1,000 mg total) by mouth 2 (two) times daily. X 10 days (Patient not taking: Reported on 10/15/2016) 20 tablet 0 Completed Course at Unknown time    Review of Systems  Constitutional: Negative for chills and fever.  Gastrointestinal: Positive for nausea. Negative for vomiting.  Genitourinary: Positive for pelvic pain and vaginal bleeding. Negative for dysuria, frequency, urgency and vaginal discharge.   Physical Exam   Blood pressure 115/71, pulse (!) 120, temperature 98.9 F (37.2 C), temperature source Oral, resp. rate 18, height 5' (1.524 m), weight 180 lb (81.6 kg), last menstrual period 09/02/2016, SpO2 96 %.  Physical Exam  Nursing note and vitals reviewed. Constitutional: She is oriented to person, place, and time. She appears well-developed and well-nourished. No distress.  HENT:  Head: Normocephalic.  Cardiovascular: Normal rate.   Respiratory: Effort normal.  GI: Soft. There is no tenderness. There is no rebound.  Neurological: She is alert and oriented to person, place, and time.  Skin: Skin is warm and dry.  Psychiatric: She has a normal mood and affect.   Results for orders placed or performed during the hospital  encounter of 10/15/16 (from the past 24 hour(s))  Urinalysis, Routine w reflex microscopic     Status: Abnormal   Collection Time: 10/15/16  8:30 PM  Result Value Ref Range   Color, Urine RED (A) YELLOW   APPearance CLOUDY (A) CLEAR   Specific Gravity, Urine  1.005 - 1.030    TEST NOT REPORTED DUE TO COLOR INTERFERENCE OF URINE PIGMENT   pH  5.0 - 8.0    TEST NOT REPORTED DUE TO COLOR INTERFERENCE OF URINE PIGMENT    Glucose, UA (A) NEGATIVE mg/dL    TEST NOT REPORTED DUE TO COLOR INTERFERENCE OF URINE PIGMENT   Hgb urine dipstick (A) NEGATIVE    TEST NOT REPORTED DUE TO COLOR INTERFERENCE OF URINE PIGMENT   Bilirubin Urine (A) NEGATIVE    TEST NOT REPORTED DUE TO COLOR INTERFERENCE OF URINE PIGMENT   Ketones, ur (A) NEGATIVE mg/dL    TEST NOT REPORTED DUE TO COLOR INTERFERENCE OF URINE PIGMENT   Protein, ur (A) NEGATIVE mg/dL    TEST NOT REPORTED DUE TO COLOR INTERFERENCE OF URINE PIGMENT   Nitrite (A) NEGATIVE    TEST NOT REPORTED DUE TO COLOR INTERFERENCE OF URINE PIGMENT   Leukocytes, UA (A) NEGATIVE    TEST NOT REPORTED DUE TO COLOR INTERFERENCE OF URINE PIGMENT  Urinalysis, Microscopic (reflex)     Status: Abnormal   Collection Time: 10/15/16  8:30 PM  Result Value Ref Range   RBC / HPF TOO NUMEROUS TO COUNT 0 - 5 RBC/hpf   WBC, UA 0-5 0 - 5 WBC/hpf   Bacteria, UA RARE (A) NONE SEEN   Squamous Epithelial / LPF 0-5 (A) NONE SEEN   Mucous PRESENT   Pregnancy, urine POC     Status: Abnormal   Collection Time: 10/15/16  9:01 PM  Result Value Ref Range   Preg Test, Ur POSITIVE (A) NEGATIVE  CBC     Status: Abnormal   Collection Time: 10/15/16  9:23 PM  Result Value Ref Range   WBC 12.7 (H) 4.0 - 10.5 K/uL   RBC 5.04 3.87 - 5.11 MIL/uL   Hemoglobin 12.0 12.0 - 15.0 g/dL   HCT 11.9 14.7 - 82.9 %   MCV 75.4 (L) 78.0 - 100.0 fL   MCH 23.8 (L) 26.0 - 34.0 pg   MCHC 31.6 30.0 - 36.0 g/dL   RDW 56.2 (H) 13.0 - 86.5 %   Platelets 332 150 - 400 K/uL  hCG, quantitative, pregnancy     Status: Abnormal   Collection Time: 10/15/16  9:23 PM  Result Value Ref Range   hCG, Beta Chain, Quant, S 966 (H) <5 mIU/mL  Wet prep, genital     Status: Abnormal   Collection Time: 10/15/16  9:25 PM  Result Value Ref Range   Yeast Wet Prep HPF POC NONE SEEN NONE SEEN   Trich, Wet Prep NONE SEEN NONE SEEN   Clue Cells Wet Prep HPF POC PRESENT (A) NONE SEEN   WBC, Wet Prep HPF POC FEW (A) NONE SEEN   Sperm  NONE SEEN    US Ob Comp Less 14 Wks  Result Date: 10/15/2016 CLINICAL DATA:  26 year old female with positive HCG levels presenting with vaginal bleeding. LMP: 09/02/2016 corresponding to an estimated gestational age of [redacted] weeks, 1 day. EXAM: OBSTETRIC <14 WK [place] AND TRANSVAGINAL OB US TECHNIQUE: Both transabdominal and transvaginal ultrasound examinations were performed for complete evaluation of the gestation as well as the maternal uterus, adnexal regions, and pelvic  cul-de-sac. Transvaginal technique was performed to assess early pregnancy. COMPARISON:  None. FINDINGS: Intrauterine gestational sac: There is a cystic structure in the lower endometrium. No significant decidual reaction. This may represent an endometrial cyst, a blighted ovum, or an early gestational sac. If this cystic structure is an early gestational sac, it is located lower than expected for a normal pregnancy. If this cystic structure is a true gestational sac, the estimated gestational age based on mean sac diameter of 7 mm is 5 weeks, 3 days. Yolk sac:  Not seen Embryo:  Not seen Cardiac Activity: N/A MSD: 7  mm   5 w   3  d Subchorionic hemorrhage:  None visualized. Maternal uterus/adnexae: Unremarkable. IMPRESSION: Small cystic structure in the lower endometrium may represent an endometrial cyst, a blighted ovum, or abnormally low positioned early gestational sac with an estimated gestational age of [redacted] weeks, 3 days. Please note the possibility of an ectopic pregnancy is not excluded. Correlation with clinical exam and serial HCG levels and follow-up with ultrasound in 7 days, or earlier if clinically indicated, recommended. Unremarkable ovaries. Electronically Signed   By: Elgie CollardArash  Radparvar M.D.   On: 10/15/2016 23:17   [place] Ob Transvaginal  Result Date: 10/15/2016 CLINICAL DATA:  26 year old female with positive HCG levels presenting with vaginal bleeding. LMP: 09/02/2016 corresponding to an estimated gestational age of [redacted] weeks, 1 day.  EXAM: OBSTETRIC <14 WK US AND TRANSVAGINAL OB US TECHNIQUE: Both transabdominal and transvaginal ultrasound examinations were performed for complete evaluation of the gestation as well as the maternal uterus, adnexal regions, and pelvic cul-de-sac. Transvaginal technique was performed to assess early pregnancy. COMPARISON:  None. FINDINGS: Intrauterine gestational sac: There is a cystic structure in the lower endometrium. No significant decidual reaction. This may represent an endometrial cyst, a blighted ovum, or an early gestational sac. If this cystic structure is an early gestational sac, it is located lower than expected for a normal pregnancy. If this cystic structure is a true gestational sac, the estimated gestational age based on mean sac diameter of 7 mm is 5 weeks, 3 days. Yolk sac:  Not seen Embryo:  Not seen Cardiac Activity: N/A MSD: 7  mm   5 w   3  d Subchorionic hemorrhage:  None visualized. Maternal uterus/adnexae: Unremarkable. IMPRESSION: Small cystic structure in the lower endometrium may represent an endometrial cyst, a blighted ovum, or abnormally low positioned early gestational sac with an estimated gestational age of [redacted] weeks, 3 days. Please note the possibility of an ectopic pregnancy is not excluded. Correlation with clinical exam and serial HCG levels and follow-up with ultrasound in 7 days, or earlier if clinically indicated, recommended. Unremarkable ovaries. Electronically Signed   By: Elgie CollardArash  Radparvar M.D.   On: 10/15/2016 23:17    MAU Course  Procedures  MDM   Assessment and Plan   1. Pregnancy, location unknown   2. Vaginal bleeding in pregnancy, first trimester    DC home Comfort measures reviewed  1st Trimester precautions  Bleeding precautions Ectopic precautions RX: none  Return to MAU as needed FU with OB as planned  Follow-up Information    [facility]   Contact information: [address] [place]  [id number] [phone]            Thressa ShellerHeather Hurley Sobel 10/15/2016, 9:10 PM

## 2016-10-16 LAB — RPR: RPR Ser Ql: NONREACTIVE

## 2016-10-16 LAB — GC/CHLAMYDIA PROBE AMP (~~LOC~~) NOT AT ARMC
CHLAMYDIA, DNA PROBE: NEGATIVE
NEISSERIA GONORRHEA: NEGATIVE

## 2016-10-16 LAB — HIV ANTIBODY (ROUTINE TESTING W REFLEX): HIV SCREEN 4TH GENERATION: NONREACTIVE

## 2016-10-18 ENCOUNTER — Ambulatory Visit: Payer: Medicaid Other

## 2016-10-18 ENCOUNTER — Inpatient Hospital Stay (HOSPITAL_COMMUNITY)
Admission: AD | Admit: 2016-10-18 | Discharge: 2016-10-18 | Disposition: A | Discharge status: Home / Self Care | Payer: Medicaid Other | Source: Ambulatory Visit | Attending: Family Medicine | Admitting: Family Medicine

## 2016-10-18 DIAGNOSIS — O039 Complete or unspecified spontaneous abortion without complication: Secondary | ICD-10-CM

## 2016-10-18 DIAGNOSIS — N939 Abnormal uterine and vaginal bleeding, unspecified: Secondary | ICD-10-CM | POA: Diagnosis present

## 2016-10-18 LAB — HCG, QUANTITATIVE, PREGNANCY: hCG, Beta Chain, Quant, S: 136 m[IU]/mL — ABNORMAL HIGH (ref <5)

## 2016-10-18 MED ORDER — HYDROCODONE-ACETAMINOPHEN 5-325 MG PO TABS: 1.0000 | ORAL_TABLET | ORAL | 0 refills | Status: DC | PRN

## 2016-10-18 NOTE — MAU Note (Signed)
Pt here for F/U labs today.  Pt continues to have lower abd cramping, tylenol not helping.  Bleeding continues, has changed 2 pads since 0800.

## 2016-10-18 NOTE — MAU Provider Note (Signed)
S:  Andrea Stokes is a 26 y.o. female [phone] @ 6780w4d here in MAU with vaginal bleeding and abdominal pain. She was seen on 6/19 with these symptoms. She is here today for a follow up hcg level. Her bleeding is consistent with bleeding like her menstrual cycle. She continues to have bilateral lower abdominal pain.   O:  GENERAL: Well-developed, well-nourished female in no acute distress.  LUNGS: Effort normal SKIN: Warm, dry and without erythema PSYCH: Normal mood and affect ABDOMEN: Soft, non-tender   Vitals:   10/18/16 1021  BP: 123/90  Pulse: 71  Resp: 18  Temp: 98.1 F (36.7 C)  TempSrc: Oral    MDM:  O positive blood type  Quant 6/19: 966 Quant 6/22: 136   A:  1. SAB (spontaneous abortion)     P:  Discharge home in stable condition [facility] Quant in 1 weeks Strict return precautions Rx: Vicodin # 10 no refills Ok to continue ibuprofen as needed, as directed on the bottle Support given   Venia Carbonasch, Jennifer I, NP 10/18/2016 12:51 PM

## 2016-10-18 NOTE — Discharge Instructions (Signed)

## 2016-10-25 ENCOUNTER — Other Ambulatory Visit: Payer: Medicaid Other

## 2016-11-28 NOTE — Telephone Encounter (Signed)
See note

## 2017-05-04 ENCOUNTER — Encounter (HOSPITAL_COMMUNITY): Payer: Self-pay | Admitting: *Deleted

## 2017-05-04 ENCOUNTER — Inpatient Hospital Stay (HOSPITAL_COMMUNITY)
Admission: AD | Admit: 2017-05-04 | Discharge: 2017-05-04 | Disposition: A | Discharge status: Hospice | Payer: Medicaid Other | Source: Ambulatory Visit | Attending: Obstetrics & Gynecology | Admitting: Obstetrics & Gynecology

## 2017-05-04 ENCOUNTER — Other Ambulatory Visit: Payer: Self-pay

## 2017-05-04 DIAGNOSIS — O219 Vomiting of pregnancy, unspecified: Secondary | ICD-10-CM | POA: Diagnosis not present

## 2017-05-04 DIAGNOSIS — Z79899 Other long term (current) drug therapy: Secondary | ICD-10-CM | POA: Diagnosis not present

## 2017-05-04 DIAGNOSIS — F32A Depression, unspecified: Secondary | ICD-10-CM

## 2017-05-04 DIAGNOSIS — O99331 Smoking (tobacco) complicating pregnancy, first trimester: Secondary | ICD-10-CM | POA: Diagnosis not present

## 2017-05-04 DIAGNOSIS — O34219 Maternal care for unspecified type scar from previous cesarean delivery: Secondary | ICD-10-CM | POA: Diagnosis not present

## 2017-05-04 DIAGNOSIS — O9989 Other specified diseases and conditions complicating pregnancy, childbirth and the puerperium: Secondary | ICD-10-CM | POA: Insufficient documentation

## 2017-05-04 DIAGNOSIS — R112 Nausea with vomiting, unspecified: Secondary | ICD-10-CM | POA: Diagnosis present

## 2017-05-04 DIAGNOSIS — Z833 Family history of diabetes mellitus: Secondary | ICD-10-CM | POA: Insufficient documentation

## 2017-05-04 DIAGNOSIS — R197 Diarrhea, unspecified: Secondary | ICD-10-CM | POA: Diagnosis not present

## 2017-05-04 DIAGNOSIS — F329 Major depressive disorder, single episode, unspecified: Secondary | ICD-10-CM | POA: Insufficient documentation

## 2017-05-04 DIAGNOSIS — O99341 Other mental disorders complicating pregnancy, first trimester: Secondary | ICD-10-CM | POA: Diagnosis not present

## 2017-05-04 DIAGNOSIS — Z3A01 Less than 8 weeks gestation of pregnancy: Secondary | ICD-10-CM | POA: Insufficient documentation

## 2017-05-04 DIAGNOSIS — O99340 Other mental disorders complicating pregnancy, unspecified trimester: Secondary | ICD-10-CM

## 2017-05-04 LAB — POCT PREGNANCY, URINE: Preg Test, Ur: POSITIVE — AB

## 2017-05-04 LAB — URINALYSIS, ROUTINE W REFLEX MICROSCOPIC
BILIRUBIN URINE: NEGATIVE
GLUCOSE, UA: NEGATIVE mg/dL
Hgb urine dipstick: NEGATIVE
KETONES UR: NEGATIVE mg/dL
Nitrite: NEGATIVE
PROTEIN: NEGATIVE mg/dL
Specific Gravity, Urine: 1.019 (ref 1.005–1.030)
pH: 7 (ref 5.0–8.0)

## 2017-05-04 MED ORDER — SERTRALINE HCL 50 MG PO TABS: 50.0000 mg | ORAL_TABLET | Freq: Every day | ORAL | 2 refills | Status: AC

## 2017-05-04 MED ORDER — SODIUM CHLORIDE 0.9 % IV SOLN
Freq: Once | INTRAVENOUS | Status: AC
  Administered 2017-05-04: 19:00:00 via INTRAVENOUS
  Filled 2017-05-04: qty 1000

## 2017-05-04 MED ORDER — PROMETHAZINE HCL 25 MG/ML IJ SOLN
25.0000 mg | Freq: Once | INTRAMUSCULAR | Status: AC
  Administered 2017-05-04: 25 mg via INTRAVENOUS
  Filled 2017-05-04: qty 1

## 2017-05-04 NOTE — MAU Provider Note (Signed)
History     CSN: [id number]  Arrival date and time: 05/04/17 1456   First Provider Initiated Contact with Patient 05/04/17 1648      Chief Complaint  Patient presents with  . Emesis   HPI  Ms.  Andrea Stokes is a 27 y.o. year old [id number] female at 20107w4d weeks gestation by LMP who presents to MAU reporting N/V/D and depressed mood. She reports eating something last night that "didn't agree with her and caused vomiting this morning". She had several episodes of vomiting and 2 episodes of diarrhea today.  Past Medical History:  Diagnosis Date  . HSV-2 (herpes simplex virus 2) infection   . Hx of chlamydia infection     Past Surgical History:  Procedure Laterality Date  . CESAREAN SECTION    . IUD REMOVAL N/A 04/08/2014   Procedure: INTRAUTERINE DEVICE (IUD) REMOVAL;  Surgeon: Tereso NewcomerUgonna A Anyanwu, MD;  Location: [facility];  Service: Gynecology;  Laterality: N/A;    Family History  Problem Relation Age of Onset  . Diabetes Mother   . Diabetes Father     Social History   Tobacco Use  . Smoking status: Current Every Day Smoker    Packs/day: 0.50    Types: Cigarettes  . Smokeless tobacco: Never Used  Substance Use Topics  . Alcohol use: Yes    Alcohol/week: 0.0 oz    Comment: socially  . Drug use: No    Allergies: No Known Allergies  Medications Prior to Admission  Medication Sig Dispense Refill Last Dose  . acetaminophen (TYLENOL) 325 MG tablet Take 650 mg by mouth every 6 (six) hours as needed for mild pain.   10/14/2016 at Unknown time  . HYDROcodone-acetaminophen (NORCO/VICODIN) 5-325 MG tablet Take 1-2 tablets by mouth every 4 (four) hours as needed. 10 tablet 0     Review of Systems  Constitutional: Negative.   HENT: Negative.   Eyes: Negative.   Respiratory: Negative.   Cardiovascular: Negative.   Gastrointestinal: Positive for diarrhea, nausea and vomiting.  Endocrine: Negative.   Genitourinary: Negative.   Musculoskeletal: Negative.   Skin: Negative.    Allergic/Immunologic: Negative.   Neurological: Negative.   Hematological: Negative.   Psychiatric/Behavioral: Positive for suicidal ideas (no plan; declines spiritual or telepsych consult ).       Depression   Physical Exam   Blood pressure 127/86, pulse 84, temperature 98.2 F (36.8 C), temperature source Oral, resp. rate 16, weight 174 lb 12 oz (79.3 kg), last menstrual period 04/02/2017, SpO2 100 %.  Physical Exam  Nursing note and vitals reviewed. Constitutional: She is oriented to person, place, and time. She appears well-developed and well-nourished.  HENT:  Head: Normocephalic.  Eyes: Pupils are equal, round, and reactive to light.  Neck: Normal range of motion.  Cardiovascular: Normal rate, regular rhythm and normal heart sounds.  Respiratory: Effort normal and breath sounds normal.  GI: Soft. Bowel sounds are normal.  Genitourinary:  Genitourinary Comments: deferred  Musculoskeletal: Normal range of motion.  Neurological: She is alert and oriented to person, place, and time.  Skin: Skin is warm and dry.  Psychiatric: She has a normal mood and affect. Her behavior is normal. Judgment and thought content normal.    MAU Course  Procedures  MDM [facility] UPT IVFs Phenergan 25 mg in NS 1000 ml at 999 ml/hr; then MVI in NS 1000 ml at 500 ml/hr -- improved N/V Patient voiced wanting to talk to MAU provider about depression. She states she is starting  to want to hurt herself, but she is "too tired to make a plan or even speak to someone tonight about it". -- offered telepsych and spiritual consults -- DECLINED, patient requesting Rx for antidepressant medication and contact info for "a professional" to speak with about depression  Results for orders placed or performed during the hospital encounter of 05/04/17 (from the past 24 hour(s))  Urinalysis, Routine w reflex microscopic     Status: Abnormal   Collection Time: 05/04/17  3:45 PM  Result Value Ref Range   Color, Urine  YELLOW YELLOW   APPearance HAZY (A) CLEAR   Specific Gravity, Urine 1.019 1.005 - 1.030   pH 7.0 5.0 - 8.0   Glucose, UA NEGATIVE NEGATIVE mg/dL   Hgb urine dipstick NEGATIVE NEGATIVE   Bilirubin Urine NEGATIVE NEGATIVE   Ketones, ur NEGATIVE NEGATIVE mg/dL   Protein, ur NEGATIVE NEGATIVE mg/dL   Nitrite NEGATIVE NEGATIVE   Leukocytes, UA TRACE (A) NEGATIVE   RBC / HPF 0-5 0 - 5 RBC/hpf   WBC, UA 6-30 0 - 5 WBC/hpf   Bacteria, UA RARE (A) NONE SEEN   Squamous Epithelial / LPF 6-30 (A) NONE SEEN   Mucus PRESENT   Pregnancy, urine POC     Status: Abnormal   Collection Time: 05/04/17  3:55 PM  Result Value Ref Range   Preg Test, Ur POSITIVE (A) NEGATIVE     Assessment and Plan  Nausea vomiting and diarrhea - Offered antiemetic medication -- pt declined  Depression affecting pregnancy, antepartum - Rx for Zoloft 50 mg daily - Schedule appt with [facility]  Discharge home Patient verbalized an understanding of the plan of care and agrees.    Raelyn Mora, MSN, CNM 05/04/2017, 5:06 PM

## 2017-05-04 NOTE — Progress Notes (Addendum)
X3K4G6P3 @ early pregnancy. UPT +. Presents to triage for "flu" like symptoms of vomiting and diarrhea. Pt thinks "food poisoning". Went out to dinner last night and after consumption "cramping and diarrhea started. Denies bleeding. Cramping in abd 8/10 pain level.   Pt is depressed and states she has thoughts of "wanting to die" but stating not suicidal. Pt states will not hurt self.   Pt declines tele consult or any suicidal watch. Provider made aware of the above.   1655: Provider at bs assessing pt.   1705: provider ordered for IV and phenergan NS bolus followed by multivitamin  1735: IV started and phenergan bolus on pump   1845 Multivitamin up.  2003: IV d/c'd with tip intact and noted.   D/C instructions given with pt understanding. Pt left unit via ambulatory

## 2017-05-04 NOTE — MAU Note (Signed)
Been vomiting since this morning.  Is dehydrated.  Wants to know what is going on, if she got a stomach virus or something. Loose stool this morning when vomiting.

## 2017-05-28 ENCOUNTER — Encounter (HOSPITAL_COMMUNITY): Payer: Self-pay

## 2017-05-28 ENCOUNTER — Inpatient Hospital Stay (HOSPITAL_COMMUNITY): Payer: Medicaid Other

## 2017-05-28 ENCOUNTER — Inpatient Hospital Stay (HOSPITAL_COMMUNITY)
Admission: AD | Admit: 2017-05-28 | Discharge: 2017-05-28 | Disposition: A | Discharge status: Home / Self Care | Payer: Medicaid Other | Source: Ambulatory Visit | Attending: Obstetrics & Gynecology | Admitting: Obstetrics & Gynecology

## 2017-05-28 DIAGNOSIS — R109 Unspecified abdominal pain: Secondary | ICD-10-CM | POA: Diagnosis not present

## 2017-05-28 DIAGNOSIS — O210 Mild hyperemesis gravidarum: Secondary | ICD-10-CM | POA: Insufficient documentation

## 2017-05-28 DIAGNOSIS — F17210 Nicotine dependence, cigarettes, uncomplicated: Secondary | ICD-10-CM | POA: Insufficient documentation

## 2017-05-28 DIAGNOSIS — R197 Diarrhea, unspecified: Secondary | ICD-10-CM | POA: Diagnosis not present

## 2017-05-28 DIAGNOSIS — O26891 Other specified pregnancy related conditions, first trimester: Secondary | ICD-10-CM

## 2017-05-28 DIAGNOSIS — Z3A08 8 weeks gestation of pregnancy: Secondary | ICD-10-CM | POA: Diagnosis not present

## 2017-05-28 DIAGNOSIS — O99331 Smoking (tobacco) complicating pregnancy, first trimester: Secondary | ICD-10-CM | POA: Insufficient documentation

## 2017-05-28 DIAGNOSIS — R112 Nausea with vomiting, unspecified: Secondary | ICD-10-CM

## 2017-05-28 DIAGNOSIS — O26899 Other specified pregnancy related conditions, unspecified trimester: Secondary | ICD-10-CM

## 2017-05-28 LAB — CBC
HEMATOCRIT: 32.2 % — AB (ref 36.0–46.0)
Hemoglobin: 10.4 g/dL — ABNORMAL LOW (ref 12.0–15.0)
MCH: 24.6 pg — AB (ref 26.0–34.0)
MCHC: 32.3 g/dL (ref 30.0–36.0)
MCV: 76.1 fL — AB (ref 78.0–100.0)
PLATELETS: 288 10*3/uL (ref 150–400)
RBC: 4.23 MIL/uL (ref 3.87–5.11)
RDW: 15 % (ref 11.5–15.5)
WBC: 13.9 10*3/uL — AB (ref 4.0–10.5)

## 2017-05-28 LAB — URINALYSIS, ROUTINE W REFLEX MICROSCOPIC
BILIRUBIN URINE: NEGATIVE
Glucose, UA: NEGATIVE mg/dL
Hgb urine dipstick: NEGATIVE
Ketones, ur: 5 mg/dL — AB
NITRITE: NEGATIVE
PH: 5 (ref 5.0–8.0)
Protein, ur: NEGATIVE mg/dL
SPECIFIC GRAVITY, URINE: 1.026 (ref 1.005–1.030)

## 2017-05-28 LAB — HCG, QUANTITATIVE, PREGNANCY: hCG, Beta Chain, Quant, S: 125613 m[IU]/mL — ABNORMAL HIGH (ref <5)

## 2017-05-28 MED ORDER — ONDANSETRON 8 MG PO TBDP
8.0000 mg | ORAL_TABLET | Freq: Once | ORAL | Status: AC
  Administered 2017-05-28: 8 mg via ORAL
  Filled 2017-05-28: qty 1

## 2017-05-28 MED ORDER — ONDANSETRON 4 MG PO TBDP: 4.0000 mg | ORAL_TABLET | Freq: Three times a day (TID) | ORAL | 0 refills | Status: AC | PRN

## 2017-05-28 NOTE — MAU Note (Signed)
Pt having nausea and says the medication isn't working. Having some cramping and spotting. Does not want to continue with pregnancy and thinks she is further along.

## 2017-05-28 NOTE — MAU Provider Note (Signed)
History     CSN: [id number]  Arrival date and time: 05/28/17 1725   None     Chief Complaint  Patient presents with  . Nausea  . Abdominal Pain   HPI   Ms.Andrea Stokes Is  27 y.o. female [phone] @ 7915w0d here in MAU with complaints of abdominal pain and nausea. States she is vomiting 3x per day. States she has tried taking phenergan and it does help, however her RX is almost out. The abdominal pain comes and goes. The pain comes everyday. States she has been calling out of work due to the pain. Stats her pain is 7/10. The pain is uncomfortable. No bleeding today.   OB History    Gravida Para Term Preterm AB Living   6 3 3  0 2 3   SAB TAB Ectopic Multiple Live Births   2 0 0 0 3      Past Medical History:  Diagnosis Date  . HSV-2 (herpes simplex virus 2) infection   . Hx of chlamydia infection     Past Surgical History:  Procedure Laterality Date  . CESAREAN SECTION    . IUD REMOVAL N/A 04/08/2014   Procedure: INTRAUTERINE DEVICE (IUD) REMOVAL;  Surgeon: Tereso NewcomerUgonna A Anyanwu, MD;  Location: [facility];  Service: Gynecology;  Laterality: N/A;    Family History  Problem Relation Age of Onset  . Diabetes Mother   . Diabetes Father     Social History   Tobacco Use  . Smoking status: Current Every Day Smoker    Packs/day: 0.25    Types: Cigarettes  . Smokeless tobacco: Never Used  Substance Use Topics  . Alcohol use: No    Alcohol/week: 0.0 oz    Frequency: Never  . Drug use: No    Allergies: No Known Allergies  Medications Prior to Admission  Medication Sig Dispense Refill Last Dose  . acetaminophen (TYLENOL) 325 MG tablet Take 650 mg by mouth every 6 (six) hours as needed for mild pain.   10/14/2016 at Unknown time  . sertraline (ZOLOFT) 50 MG tablet Take 1 tablet (50 mg total) by mouth daily. 30 tablet 2    Recent Results (from the past 2160 hour(s))  Urinalysis, Routine w reflex microscopic     Status: Abnormal   Collection Time: 05/04/17  3:45 PM  Result  Value Ref Range   Color, Urine YELLOW YELLOW   APPearance HAZY (A) CLEAR   Specific Gravity, Urine 1.019 1.005 - 1.030   pH 7.0 5.0 - 8.0   Glucose, UA NEGATIVE NEGATIVE mg/dL   Hgb urine dipstick NEGATIVE NEGATIVE   Bilirubin Urine NEGATIVE NEGATIVE   Ketones, ur NEGATIVE NEGATIVE mg/dL   Protein, ur NEGATIVE NEGATIVE mg/dL   Nitrite NEGATIVE NEGATIVE   Leukocytes, UA TRACE (A) NEGATIVE   RBC / HPF 0-5 0 - 5 RBC/hpf   WBC, UA 6-30 0 - 5 WBC/hpf   Bacteria, UA RARE (A) NONE SEEN   Squamous Epithelial / LPF 6-30 (A) NONE SEEN   Mucus PRESENT   Pregnancy, urine POC     Status: Abnormal   Collection Time: 05/04/17  3:55 PM  Result Value Ref Range   Preg Test, Ur POSITIVE (A) NEGATIVE    Comment:        THE SENSITIVITY OF THIS METHODOLOGY IS >24 mIU/mL   Urinalysis, Routine w reflex microscopic     Status: Abnormal   Collection Time: 05/28/17  5:39 PM  Result Value Ref Range   Color,  Urine YELLOW YELLOW   APPearance HAZY (A) CLEAR   Specific Gravity, Urine 1.026 1.005 - 1.030   pH 5.0 5.0 - 8.0   Glucose, UA NEGATIVE NEGATIVE mg/dL   Hgb urine dipstick NEGATIVE NEGATIVE   Bilirubin Urine NEGATIVE NEGATIVE   Ketones, ur 5 (A) NEGATIVE mg/dL   Protein, ur NEGATIVE NEGATIVE mg/dL   Nitrite NEGATIVE NEGATIVE   Leukocytes, UA TRACE (A) NEGATIVE   RBC / HPF 0-5 0 - 5 RBC/hpf   WBC, UA 6-30 0 - 5 WBC/hpf   Bacteria, UA RARE (A) NONE SEEN   Squamous Epithelial / LPF 0-5 (A) NONE SEEN   Mucus PRESENT    US Ob Comp Less 14 Wks  Result Date: 05/28/2017 CLINICAL DATA:  Spotting, cramping EXAM: OBSTETRIC <14 WK ULTRASOUND TECHNIQUE: Transabdominal ultrasound was performed for evaluation of the gestation as well as the maternal uterus and adnexal regions. COMPARISON:  None. FINDINGS: Intrauterine gestational sac: Single Yolk sac:  Visualized Embryo:  Visualized Cardiac Activity: Visualized Heart Rate: 150 bpm MSD:   mm    w     d CRL:   10.9 mm   7 w 1 d                  [place] EDC:  01/13/2018 Subchorionic hemorrhage:  None visualized. Maternal uterus/adnexae: No adnexal masses or free fluid. IMPRESSION: Seven week 1 day intrauterine pregnancy. Fetal heart rate 150 beats per minute. No acute maternal findings. Electronically Signed   By: Charlett Nose M.D.   On: 05/28/2017 19:31   Review of Systems  Constitutional: Negative for fever.  Gastrointestinal: Positive for abdominal pain, nausea and vomiting.  Genitourinary: Negative for vaginal bleeding, vaginal discharge and vaginal pain.   Physical Exam   Blood pressure 130/67, pulse 94, temperature 98.4 F (36.9 C), temperature source Oral, resp. rate 18, weight 178 lb (80.7 kg), last menstrual period 04/02/2017, SpO2 100 %, unknown if currently breastfeeding.  Physical Exam  Constitutional: She is oriented to person, place, and time. She appears well-developed and well-nourished. No distress.  HENT:  Head: Normocephalic.  GI: Soft. There is no hepatosplenomegaly. There is generalized tenderness. There is no rebound and no CVA tenderness.  Musculoskeletal: Normal range of motion.  Neurological: She is alert and oriented to person, place, and time.  Skin: Skin is warm. She is not diaphoretic.  Psychiatric: Her behavior is normal.    MAU Course  Procedures  None  MDM  [place] Zofran given ODT  Patient states her nausea has improved significantly. Discussed [place] with Patient in detail.   Assessment and Plan   A:  1. Abdominal pain in pregnancy, antepartum   2. Nausea vomiting and diarrhea     P:  Discharge home in stable condition Return to MAU if symptoms worsen Rx: Zofran Start prenatal care Prenatal vitamins daily Small, frequent meals  Rasch, Harolyn Rutherford, NP 05/28/2017 8:46 PM

## 2017-05-28 NOTE — Discharge Instructions (Signed)

## 2017-09-23 ENCOUNTER — Ambulatory Visit: Payer: Medicaid Other

## 2017-09-23 ENCOUNTER — Inpatient Hospital Stay (HOSPITAL_COMMUNITY)
Admission: AD | Admit: 2017-09-23 | Discharge: 2017-09-23 | Disposition: A | Discharge status: Home / Self Care | Payer: Medicaid Other | Source: Ambulatory Visit | Attending: Obstetrics and Gynecology | Admitting: Obstetrics and Gynecology

## 2017-09-23 ENCOUNTER — Inpatient Hospital Stay (HOSPITAL_COMMUNITY): Payer: Medicaid Other

## 2017-09-23 ENCOUNTER — Encounter (HOSPITAL_COMMUNITY): Payer: Self-pay | Admitting: *Deleted

## 2017-09-23 DIAGNOSIS — Z3A01 Less than 8 weeks gestation of pregnancy: Secondary | ICD-10-CM | POA: Insufficient documentation

## 2017-09-23 DIAGNOSIS — O98312 Other infections with a predominantly sexual mode of transmission complicating pregnancy, second trimester: Secondary | ICD-10-CM | POA: Insufficient documentation

## 2017-09-23 DIAGNOSIS — Z833 Family history of diabetes mellitus: Secondary | ICD-10-CM | POA: Diagnosis not present

## 2017-09-23 DIAGNOSIS — F17210 Nicotine dependence, cigarettes, uncomplicated: Secondary | ICD-10-CM | POA: Insufficient documentation

## 2017-09-23 DIAGNOSIS — Z79899 Other long term (current) drug therapy: Secondary | ICD-10-CM | POA: Insufficient documentation

## 2017-09-23 DIAGNOSIS — A5901 Trichomonal vulvovaginitis: Secondary | ICD-10-CM | POA: Insufficient documentation

## 2017-09-23 DIAGNOSIS — O99332 Smoking (tobacco) complicating pregnancy, second trimester: Secondary | ICD-10-CM | POA: Diagnosis not present

## 2017-09-23 DIAGNOSIS — Z9889 Other specified postprocedural states: Secondary | ICD-10-CM | POA: Insufficient documentation

## 2017-09-23 DIAGNOSIS — O209 Hemorrhage in early pregnancy, unspecified: Secondary | ICD-10-CM | POA: Insufficient documentation

## 2017-09-23 DIAGNOSIS — O208 Other hemorrhage in early pregnancy: Secondary | ICD-10-CM

## 2017-09-23 LAB — WET PREP, GENITAL
Clue Cells Wet Prep HPF POC: NONE SEEN
Sperm: NONE SEEN
Yeast Wet Prep HPF POC: NONE SEEN

## 2017-09-23 LAB — CBC
HCT: 34.4 % — ABNORMAL LOW (ref 36.0–46.0)
HEMOGLOBIN: 10.8 g/dL — AB (ref 12.0–15.0)
MCH: 24.3 pg — ABNORMAL LOW (ref 26.0–34.0)
MCHC: 31.4 g/dL (ref 30.0–36.0)
MCV: 77.3 fL — ABNORMAL LOW (ref 78.0–100.0)
Platelets: 281 10*3/uL (ref 150–400)
RBC: 4.45 MIL/uL (ref 3.87–5.11)
RDW: 15.5 % (ref 11.5–15.5)
WBC: 10.4 10*3/uL (ref 4.0–10.5)

## 2017-09-23 LAB — URINALYSIS, ROUTINE W REFLEX MICROSCOPIC
Bilirubin Urine: NEGATIVE
Glucose, UA: NEGATIVE mg/dL
HGB URINE DIPSTICK: NEGATIVE
Ketones, ur: NEGATIVE mg/dL
Nitrite: NEGATIVE
PROTEIN: NEGATIVE mg/dL
SPECIFIC GRAVITY, URINE: 1.019 (ref 1.005–1.030)
pH: 8 (ref 5.0–8.0)

## 2017-09-23 LAB — POCT PREGNANCY, URINE: Preg Test, Ur: POSITIVE — AB

## 2017-09-23 LAB — HCG, QUANTITATIVE, PREGNANCY: HCG, BETA CHAIN, QUANT, S: 117135 m[IU]/mL — AB (ref <5)

## 2017-09-23 MED ORDER — PRENATAL PLUS 27-1 MG PO TABS: 1.0000 | ORAL_TABLET | Freq: Every day | ORAL | 0 refills | Status: AC

## 2017-09-23 MED ORDER — METRONIDAZOLE 500 MG PO TABS: 500.0000 mg | ORAL_TABLET | Freq: Two times a day (BID) | ORAL | 0 refills | Status: AC

## 2017-09-23 NOTE — MAU Provider Note (Signed)
History     CSN: [id number]  Arrival date and time: 09/23/17 1521   First Provider Initiated Contact with Patient 09/23/17 1615      Chief Complaint  Patient presents with  . Possible Pregnancy  . Abdominal Pain  . Vaginal Bleeding   Andrea Stokes is a 27 y.o. [phone] at [redacted]w[redacted]d presenting with vaginal spotting and mild cramping.  LNMP was sometime in April and she first noted intermittent pink spotting on toilet paper after voiding.  She again experienced pink spotting last night but none today.  Denies antecedent intercourse with any spotting episode. No irritative vaginal discharge.  She had onset menstrual-like suprapubic cramping last night which has continued intermittently today. Pain does not radiate. No home treatment. She has had nausea for a couple of days and vomiting last night only. Appetite decreased today but retaining food and fluids. She had early pregnancy termination in February 2019 followed by normal menses in March and April. Undecided if pregnancy desired.    OB History  Gravida Para Term Preterm AB Living  0 3 3  SAB TAB Ectopic Multiple Live Births  2 1 0 0 3    # Outcome Date GA Lbr Len/2nd Weight Sex Delivery Anes PTL Lv  7 Current           6 Term 10/11/08    M CS-LTranv   LIV  5 Term 03/09/06    M CS-LTranv   LIV  4 Term 08/13/04     CS-LTranv   LIV  3 TAB           2 SAB           1 SAB              Past Medical History:  Diagnosis Date  . HSV-2 (herpes simplex virus 2) infection   . Hx of chlamydia infection     Past Surgical History:  Procedure Laterality Date  . CESAREAN SECTION    . IUD REMOVAL N/A 04/08/2014   Procedure: INTRAUTERINE DEVICE (IUD) REMOVAL;  Surgeon: Tereso Newcomer, MD;  Location: [facility];  Service: Gynecology;  Laterality: N/A;    Family History  Problem Relation Age of Onset  . Diabetes Mother   . Diabetes Father     Social History   Tobacco Use  . Smoking status: Current Every Day Smoker     Packs/day: 0.25    Types: Cigarettes  . Smokeless tobacco: Never Used  Substance Use Topics  . Alcohol use: No    Alcohol/week: 0.0 oz    Frequency: Never  . Drug use: No    Allergies: No Known Allergies  Medications Prior to Admission  Medication Sig Dispense Refill Last Dose  . acetaminophen (TYLENOL) 325 MG tablet Take 650 mg by mouth every 6 (six) hours as needed for mild pain.   prn  . ondansetron (ZOFRAN ODT) 4 MG disintegrating tablet Take 1 tablet (4 mg total) by mouth every 8 (eight) hours as needed for nausea or vomiting. 20 tablet 0   . sertraline (ZOLOFT) 50 MG tablet Take 1 tablet (50 mg total) by mouth daily. 30 tablet 2     Review of Systems  Constitutional: Positive for fatigue. Negative for appetite change and fever.  Gastrointestinal: Positive for abdominal pain.       Cramps  Genitourinary: Positive for frequency, pelvic pain and vaginal bleeding. Negative for difficulty urinating, dyspareunia, dysuria, hematuria, vaginal discharge and vaginal pain.  Physical Exam   Blood pressure 119/64, pulse 88, temperature 99.1 F (37.3 C), temperature source Oral, resp. rate 16, height 5' (1.524 m), weight 174 lb (78.9 kg), last menstrual period 08/02/2017, SpO2 98 %, unknown if currently breastfeeding.  Physical Exam  Nursing note and vitals reviewed. Constitutional: She is oriented to person, place, and time. She appears well-developed and well-nourished. No distress.  HENT:  Head: Normocephalic.  Eyes: No scleral icterus.  Neck: Neck supple.  Cardiovascular: Normal rate.  Respiratory: Effort normal.  GI: Soft. There is no tenderness.  Genitourinary: Vaginal discharge found.  Genitourinary Comments: NEFG. Vagina with moderate thin white homogenous discharge. Cervix without lesions, parous os, no blood seen. Uterus retroverted, slightly enlarged, NT   Musculoskeletal: Normal range of motion.  Neurological: She is alert and oriented to person, place, and time.   Skin: Skin is warm and dry.  Psychiatric: She has a normal mood and affect. Her behavior is normal.    MAU Course  Procedures Results for orders placed or performed during the hospital encounter of 09/23/17 (from the past 24 hour(s))  Urinalysis, Routine w reflex microscopic     Status: Abnormal   Collection Time: 09/23/17  3:37 PM  Result Value Ref Range   Color, Urine YELLOW YELLOW   APPearance CLOUDY (A) CLEAR   Specific Gravity, Urine 1.019 1.005 - 1.030   pH 8.0 5.0 - 8.0   Glucose, UA NEGATIVE NEGATIVE mg/dL   Hgb urine dipstick NEGATIVE NEGATIVE   Bilirubin Urine NEGATIVE NEGATIVE   Ketones, ur NEGATIVE NEGATIVE mg/dL   Protein, ur NEGATIVE NEGATIVE mg/dL   Nitrite NEGATIVE NEGATIVE   Leukocytes, UA TRACE (A) NEGATIVE   RBC / HPF 0-5 0 - 5 RBC/hpf   WBC, UA 11-20 0 - 5 WBC/hpf   Bacteria, UA RARE (A) NONE SEEN   Squamous Epithelial / LPF 0-5 0 - 5   Mucus PRESENT    Amorphous Crystal PRESENT   Pregnancy, urine POC     Status: Abnormal   Collection Time: 09/23/17  3:54 PM  Result Value Ref Range   Preg Test, Ur POSITIVE (A) NEGATIVE  Wet prep, genital     Status: Abnormal   Collection Time: 09/23/17  4:45 PM  Result Value Ref Range   Yeast Wet Prep HPF POC NONE SEEN NONE SEEN   Trich, Wet Prep PRESENT (A) NONE SEEN   Clue Cells Wet Prep HPF POC NONE SEEN NONE SEEN   WBC, Wet Prep HPF POC MANY (A) NONE SEEN   Sperm NONE SEEN   CBC     Status: Abnormal   Collection Time: 09/23/17  5:00 PM  Result Value Ref Range   WBC 10.4 4.0 - 10.5 K/uL   RBC 4.45 3.87 - 5.11 MIL/uL   Hemoglobin 10.8 (L) 12.0 - 15.0 g/dL   HCT 16.1 (L) 09.6 - 04.5 %   MCV 77.3 (L) 78.0 - 100.0 fL   MCH 24.3 (L) 26.0 - 34.0 pg   MCHC 31.4 30.0 - 36.0 g/dL   RDW 40.9 81.1 - 91.4 %   Platelets 281 150 - 400 K/uL   Urine  Culture sent US Ob Comp Less 14 Wks  Result Date: 09/23/2017 CLINICAL DATA:  Vaginal bleeding in 1st trimester pregnancy. Pelvic cramping. Gestational age by LMP of 7 weeks  3 days. EXAM: OBSTETRIC <14 WK ULTRASOUND TECHNIQUE: Transabdominal ultrasound was performed for evaluation of the gestation as well as the maternal uterus and adnexal regions. COMPARISON:  None. FINDINGS: Intrauterine  gestational sac: Single Yolk sac:  Visualized. Embryo:  Visualized. Cardiac Activity: Visualized. Heart Rate: 131 bpm CRL:   12 mm   7 w 3 d                  [place] EDC: 05/09/2018 Subchorionic hemorrhage:  None visualized. Maternal uterus/adnexae: Normal appearance of both ovaries. No mass or abnormal free fluid identified. IMPRESSION: Single living IUP measuring 7 weeks 3 days, with [place] EDC of 05/09/2018. This is concordant with LMP. No significant maternal uterine or adnexal abnormality identified. Electronically Signed   By: Myles Rosenthal M.D.   On: 09/23/2017 17:52      Assessment and Plan  Bleeding in early pregnancy  Vaginal bleeding affecting early pregnancy  Trichomonas vaginalis (TV) infection  Allergies as of 09/23/2017   No Known Allergies     Medication List    STOP taking these medications   acetaminophen 325 MG tablet Commonly known as:  TYLENOL     TAKE these medications   metroNIDAZOLE 500 MG tablet Commonly known as:  FLAGYL Take 1 tablet (500 mg total) by mouth 2 (two) times daily.   ondansetron 4 MG disintegrating tablet Commonly known as:  ZOFRAN ODT Take 1 tablet (4 mg total) by mouth every 8 (eight) hours as needed for nausea or vomiting.   prenatal vitamin w/FE, FA 27-1 MG Tabs tablet Take 1 tablet by mouth daily.   sertraline 50 MG tablet Commonly known as:  ZOLOFT Take 1 tablet (50 mg total) by mouth daily.      Follow-up Information    Department, [facility]. Schedule an appointment as soon as possible for a visit in 1 week(s).   Contact information: [address] [place] [address] [phone]          Expedited partner therapy for trich: Rx to [facility]  Information on [facility] Eligibility and list of  pregnancy care providers given.   Valborg Friar CNM 09/23/2017, 4:15 PM

## 2017-09-23 NOTE — MAU Note (Signed)
Pt reports positive home preg test last week, spotting today and abd cramping.

## 2017-09-23 NOTE — Discharge Instructions (Signed)
Vaginal Bleeding During Pregnancy, First Trimester A small amount of bleeding (spotting) from the vagina is common in early pregnancy. Sometimes the bleeding is normal and is not a problem, and sometimes it is a sign of something serious. Be sure to tell your doctor about any bleeding from your vagina right away. Follow these instructions at home:  Watch your condition for any changes.  Follow your doctor's instructions about how active you can be.  If you are on bed rest: ? You may need to stay in bed and only get up to use the bathroom. ? You may be allowed to do some activities. ? If you need help, make plans for someone to help you.  Write down: ? The number of pads you use each day. ? How often you change pads. ? How soaked (saturated) your pads are.  Do not use tampons.  Do not douche.  Do not have sex or orgasms until your doctor says it is okay.  If you pass any tissue from your vagina, save the tissue so you can show it to your doctor.  Only take medicines as told by your doctor.  Do not take aspirin because it can make you bleed.  Keep all follow-up visits as told by your doctor. Contact a doctor if:  You bleed from your vagina.  You have cramps.  You have labor pains.  You have a fever that does not go away after you take medicine. Get help right away if:  You have very bad cramps in your back or belly (abdomen).  You pass large clots or tissue from your vagina.  You bleed more.  You feel light-headed or weak.  You pass out (faint).  You have chills.  You are leaking fluid or have a gush of fluid from your vagina.  You pass out while pooping (having a bowel movement). This information is not intended to replace advice given to you by your health care provider. Make sure you discuss any questions you have with your health care provider. Document Released: 08/30/2013 Document Revised: 09/21/2015 Document Reviewed: 12/21/2012 Elsevier Interactive  Patient Education  2018 [employer]. Expedited Partner Therapy:  Information Sheet for Patients and Partners               You have been offered expedited partner therapy (EPT). This information sheet contains important information and warnings you need to be aware of, so please read it carefully.   Expedited Partner Therapy (EPT) is the clinical practice of treating the sexual partners of persons who receive chlamydia, gonorrhea, or trichomoniasis diagnoses by providing medications or prescriptions to the patient. Patients then provide partners with these therapies without the health-care provider having examined the partner. In other words, EPT is a convenient, fast and private way for patients to help their sexual partners get treated.   Chlamydia and gonorrhea are bacterial infections you get from having sex with a person who is already infected. Trichomoniasis (or trich) is a very common sexually transmitted infection (STI) that is caused by infection with a protozoan parasite called Trichomonas vaginalis.  Many people with these infections dont know it because they feel fine, but without treatment these infections can cause serious health problems, such as pelvic inflammatory disease, ectopic pregnancy, infertility and increased risk of HIV.   It is important to get treated as soon as possible to protect your health, to avoid spreading these infections to others, and to prevent yourself from becoming re-infected. The good news is these  infections can be easily cured with proper antibiotic medicine. The best way to take care of your self is to see a doctor or go to your local health department. If you are not able to see a doctor or other medical provider, you should take EPT.    Recommended Medication: EPT for Chlamydia:  Azithromycin (Zithromax) 1 gram orally in a single dose EPT for Gonorrhea:  Cefixime (Suprax) 400 milligrams orally in a single dose PLUS azithromycin (Zithromax) 1 gram  orally in a single dose EPT for Trichomoniasis:  Metronidazole (Flagyl) 2 grams orally in a single dose   These medicines are very safe. However, you should not take them if you have ever had an allergic reaction (like a rash) to any of these medicines: azithromycin (Zithromax), erythromycin, clarithromycin (Biaxin), metronidazole (Flagyl), tinidazole (Tindimax). If you are uncertain about whether you have an allergy, call your medical provider or pharmacist before taking this medicine. If you have a serious, long-term illness like kidney, liver or heart disease, colitis or stomach problems, or you are currently taking other prescription medication, talk to your provider before taking this medication.   Women: If you have lower belly pain, pain during sex, vomiting, or a fever, do not take this medicine. Instead, you should see a medical provider to be certain you do not have pelvic inflammatory disease (PID). PID can be serious and lead to infertility, pregnancy problems or chronic pelvic pain.   Pregnant Women: It is very important for you to see a doctor to get pregnancy services and pre-natal care. These antibiotics for EPT are safe for pregnant women, but you still need to see a medical provider as soon as possible. It is also important to note that Doxycycline is an alternative therapy for chlamydia, but it should not be taken by someone who is pregnant.   Men: If you have pain or swelling in the testicles or a fever, do not take this medicine and see a medical provider.     Men who have sex with men (MSM): MSM in [place] continue to experience high rates of syphilis and HIV. Many MSM with gonorrhea or chlamydia could also have syphilis and/or HIV and not know it. If you are a man who has sex with other men, it is very important that you see a medical provider and are tested for HIV and syphilis. EPT is not recommended for gonorrhea for MSM.  Recommended treatment for gonorrhea for MSM is  Rocephin (shot) AND azithromycin due to decreased cure rate.  Please see your medical provider if this is the case.    Along with this information sheet is a prescription for the medicine. If you receive a prescription it will be in your name and will indicate your date of birth, or it will be in the name of Expedited Partner Therapy.   In either case, you can have the prescription filled at a pharmacy. You will be responsible for the cost of the medicine, unless you have prescription drug coverage. In that case, you could provide your name so the pharmacy could bill your health plan.   Take the medication as directed. Some people will have a mild, upset stomach, which does not last long. AVOID alcohol 24 hours after taking metronidazole (Flagyl) to reduce the possibility of a disulfiram-like reaction (severe vomiting and abdominal pain).  After taking the medicine, do not have sex for 7 days. Do not share this medicine or give it to anyone else. It is important  to tell everyone you have had sex with in the last 60 days that they need to go and get tested for sexually transmitted infections.   Ways to prevent these and other sexually transmitted infections (STIs):    Abstain from sex. This is the only sure way to avoid getting an STI.   Use barrier methods, such as condoms, consistently and correctly.   Limit the number of sexual partners.   Have regular physical exams, including testing for STIs.   For more information about EPT or other issues pertaining to an STI, please contact your medical provider or the [facility] Department at [phone] or [email or web address]    Trichomoniasis Trichomoniasis is an STI (sexually transmitted infection) that can affect both women and men. In women, the outer area of the female genitalia (vulva) and the vagina are affected. In men, the penis is mainly affected, but the  prostate and other reproductive organs can also be involved. This condition can be treated with medicine. It often has no symptoms (is asymptomatic), especially in men. What are the causes? This condition is caused by an organism called Trichomonas vaginalis. Trichomoniasis most often spreads from person to person (is contagious) through sexual contact. What increases the risk? The following factors may make you more likely to develop this condition:  Having unprotected sexual intercourse.  Having sexual intercourse with a partner who has trichomoniasis.  Having multiple sexual partners.  Having had previous trichomoniasis infections or other STIs.  What are the signs or symptoms? In women, symptoms of trichomoniasis include:  Abnormal vaginal discharge that is clear, white, gray, or yellow-green and foamy and has an unusual "fishy" odor.  Itching and irritation of the vagina and vulva.  Burning or pain during urination or sexual intercourse.  Genital redness and swelling.  In men, symptoms of trichomoniasis include:  Penile discharge that may be foamy or contain pus.  Pain in the penis. This may happen only when urinating.  Itching or irritation inside the penis.  Burning after urination or ejaculation.  How is this diagnosed? In women, this condition may be found during a routine Pap test or physical exam. It may be found in men during a routine physical exam. Your health care provider may perform tests to help diagnose this infection, such as:  Urine tests (men and women).  The following in women: ? Testing the pH of the vagina. ? A vaginal swab test that checks for the Trichomonas vaginalis organism. ? Testing vaginal secretions.  Your health care provider may test you for other STIs, including HIV (human immunodeficiency virus). How is this treated? This condition is treated with medicine taken by mouth (orally), such as metronidazole or tinidazole to fight the  infection. Your sexual partner(s) may also need to be tested and treated.  If you are a woman and you plan to become pregnant or think you may be pregnant, tell your health care provider right away. Some medicines that are used to treat the infection should not be taken during pregnancy.  Your health care provider may recommend over-the-counter medicines or creams to help relieve itching or irritation. You may be tested for infection again 3 months after treatment. Follow these instructions at home:  Take and use over-the-counter and prescription medicines, including creams, only as told by your health care provider.  Do not have sexual intercourse until one week after you finish your medicine, or until your health care provider approves. Ask your health care provider when you  may resume sexual intercourse.  (Women) Do not douche or wear tampons while you have the infection.  Discuss your infection with your sexual partner(s). Make sure that your partner gets tested and treated, if necessary.  Keep all follow-up visits as told by your health care provider. This is important. How is this prevented?  Use condoms every time you have sex. Using condoms correctly and consistently can help protect against STIs.  Avoid having multiple sexual partners.  Talk with your sexual partner about any symptoms that either of you may have, as well as any history of STIs.  Get tested for STIs and STDs (sexually transmitted diseases) before you have sex. Ask your partner to do the same.  Do not have sexual contact if you have symptoms of trichomoniasis or another STI. Contact a health care provider if:  You still have symptoms after you finish your medicine.  You develop pain in your abdomen.  You have pain when you urinate.  You have bleeding after sexual intercourse.  You develop a rash.  You feel nauseous or you vomit.  You plan to become pregnant or think you may be  pregnant. Summary  Trichomoniasis is an STI (sexually transmitted infection) that can affect both women and men.  This condition often has no symptoms (is asymptomatic), especially in men.  You should not have sexual intercourse until one week after you finish your medicine, or until your health care provider approves. Ask your health care provider when you may resume sexual intercourse.  Discuss your infection with your sexual partner. Make sure that your partner gets tested and treated, if necessary. This information is not intended to replace advice given to you by your health care provider. Make sure you discuss any questions you have with your health care provider. Document Released: 10/09/2000 Document Revised: 03/08/2016 Document Reviewed: 03/08/2016 Elsevier Interactive Patient Education  2017 [employer].  Sexually Transmitted Disease A sexually transmitted disease (STD) is a disease or infection that may be passed (transmitted) from person to person, usually during sexual activity. This may happen by way of saliva, semen, blood, vaginal mucus, or urine. Common STDs include:  Gonorrhea.  Chlamydia.  Syphilis.  HIV and AIDS.  Genital herpes.  Hepatitis B and C.  Trichomonas.  Human papillomavirus (HPV).  Pubic lice.  Scabies.  Mites.  Bacterial vaginosis.  What are the causes? An STD may be caused by bacteria, a virus, or parasites. STDs are often transmitted during sexual activity if one person is infected. However, they may also be transmitted through nonsexual means. STDs may be transmitted after:  Sexual intercourse with an infected person.  Sharing sex toys with an infected person.  Sharing needles with an infected person or using unclean piercing or tattoo needles.  Having intimate contact with the genitals, mouth, or rectal areas of an infected person.  Exposure to infected fluids during birth.  What are the signs or symptoms? Different STDs  have different symptoms. Some people may not have any symptoms. If symptoms are present, they may include:  Painful or bloody urination.  Pain in the pelvis, abdomen, vagina, anus, throat, or eyes.  A skin rash, itching, or irritation.  Growths, ulcerations, blisters, or sores in the genital and anal areas.  Abnormal vaginal discharge with or without bad odor.  Penile discharge in men.  Fever.  Pain or bleeding during sexual intercourse.  Swollen glands in the groin area.  Yellow skin and eyes (jaundice). This is seen with hepatitis.  Swollen  testicles.  Infertility.  Sores and blisters in the mouth.  How is this diagnosed? To make a diagnosis, your health care provider may:  Take a medical history.  Perform a physical exam.  Take a sample of any discharge to examine.  Swab the throat, cervix, opening to the penis, rectum, or vagina for testing.  Test a sample of your first morning urine.  Perform blood tests.  Perform a Pap test, if this applies.  Perform a colposcopy.  Perform a laparoscopy.  How is this treated? Treatment depends on the STD. Some STDs may be treated but not cured.  Chlamydia, gonorrhea, trichomonas, and syphilis can be cured with antibiotic medicine.  Genital herpes, hepatitis, and HIV can be treated, but not cured, with prescribed medicines. The medicines lessen symptoms.  Genital warts from HPV can be treated with medicine or by freezing, burning (electrocautery), or surgery. Warts may come back.  HPV cannot be cured with medicine or surgery. However, abnormal areas may be removed from the cervix, vagina, or vulva.  If your diagnosis is confirmed, your recent sexual partners need treatment. This is true even if they are symptom-free or have a negative culture or evaluation. They should not have sex until their health care providers say it is okay.  Your health care provider may test you for infection again 3 months after  treatment.  How is this prevented? Take these steps to reduce your risk of getting an STD:  Use latex condoms, dental dams, and water-soluble lubricants during sexual activity. Do not use petroleum jelly or oils.  Avoid having multiple sex partners.  Do not have sex with someone who has other sex partners.  Do not have sex with anyone you do not know or who is at high risk for an STD.  Avoid risky sex practices that can break your skin.  Do not have sex if you have open sores on your mouth or skin.  Avoid drinking too much alcohol or taking illegal drugs. Alcohol and drugs can affect your judgment and put you in a vulnerable position.  Avoid engaging in oral and anal sex acts.  Get vaccinated for HPV and hepatitis. If you have not received these vaccines in the past, talk to your health care provider about whether one or both might be right for you.  If you are at risk of being infected with HIV, it is recommended that you take a prescription medicine daily to prevent HIV infection. This is called pre-exposure prophylaxis (PrEP). You are considered at risk if: ? You are a man who has sex with other men (MSM). ? You are a heterosexual man or woman and are sexually active with more than one partner. ? You take drugs by injection. ? You are sexually active with a partner who has HIV.  Talk with your health care provider about whether you are at high risk of being infected with HIV. If you choose to begin PrEP, you should first be tested for HIV. You should then be tested every 3 months for as long as you are taking PrEP.  Contact a health care provider if:  See your health care provider.  Tell your sexual partner(s). They should be tested and treated for any STDs.  Do not have sex until your health care provider says it is okay. Get help right away if: Contact your health care provider right away if:  You have severe abdominal pain.  You are a man and notice swelling or pain  in your testicles.  You are a woman and notice swelling or pain in your vagina.  This information is not intended to replace advice given to you by your health care provider. Make sure you discuss any questions you have with your health care provider. Document Released: 07/06/2002 Document Revised: 11/03/2015 Document Reviewed: 11/03/2012 Elsevier Interactive Patient Education  2018 [employer].

## 2017-09-24 LAB — CULTURE, OB URINE: CULTURE: NO GROWTH

## 2017-09-24 LAB — GC/CHLAMYDIA PROBE AMP (~~LOC~~) NOT AT ARMC
CHLAMYDIA, DNA PROBE: NEGATIVE
NEISSERIA GONORRHEA: NEGATIVE

## 2017-09-24 LAB — HIV ANTIBODY (ROUTINE TESTING W REFLEX): HIV SCREEN 4TH GENERATION: NONREACTIVE

## 2017-12-03 ENCOUNTER — Ambulatory Visit (INDEPENDENT_AMBULATORY_CARE_PROVIDER_SITE_OTHER): Payer: Medicaid Other | Admitting: Family Medicine

## 2017-12-03 ENCOUNTER — Other Ambulatory Visit: Payer: Self-pay

## 2017-12-03 ENCOUNTER — Other Ambulatory Visit (HOSPITAL_COMMUNITY)
Admission: RE | Admit: 2017-12-03 | Discharge: 2017-12-03 | Disposition: A | Discharge status: Home / Self Care | Payer: Medicaid Other | Source: Ambulatory Visit | Attending: Family Medicine | Admitting: Family Medicine

## 2017-12-03 VITALS — BP 108/74 | HR 90 | Temp 98.4°F | Ht 60.0 in | Wt 170.2 lb

## 2017-12-03 DIAGNOSIS — Z3202 Encounter for pregnancy test, result negative: Secondary | ICD-10-CM

## 2017-12-03 DIAGNOSIS — R300 Dysuria: Secondary | ICD-10-CM | POA: Insufficient documentation

## 2017-12-03 DIAGNOSIS — N898 Other specified noninflammatory disorders of vagina: Secondary | ICD-10-CM

## 2017-12-03 LAB — POCT URINALYSIS DIP (MANUAL ENTRY)
Bilirubin, UA: NEGATIVE
Glucose, UA: NEGATIVE mg/dL
Ketones, POC UA: NEGATIVE mg/dL
NITRITE UA: NEGATIVE
PH UA: 6 (ref 5.0–8.0)
Spec Grav, UA: >=1.03 — AB (ref 1.010–1.025)
UROBILINOGEN UA: 1 U/dL

## 2017-12-03 LAB — POCT URINE PREGNANCY: PREG TEST UR: NEGATIVE

## 2017-12-03 MED ORDER — CEPHALEXIN 500 MG PO CAPS: 500.0000 mg | ORAL_CAPSULE | Freq: Two times a day (BID) | ORAL | 0 refills | Status: AC

## 2017-12-03 NOTE — Patient Instructions (Addendum)
It was a pleasure to see you today! Thank you for choosing [facility] for your primary care. Andrea MorinKanani P Nealis was seen for uncomfortable urination. Come back to the clinic if you have any routine symptoms, and go to the emergency room if you have any life threatening symptoms.   Today we did a urine test for infection and pregnancy and swabs for a number of specific infections.  We'll let you know the results as soon as we do.   If we did any lab work today that did not result today, one of two things will happen.  1. If everything is normal, you will get a letter in mail sent to the address in your chart with the results for your records.  It is important to keep your address up to date as that is where we will send results.  2. If the results require some sort of discussion, my nurses or myself will call you on the phone number listed in your records.  It is important to keep your phone number up to date in our system as this is how we will try to reach you.  If we cannot reach you on the phone, we will try to send you a letter in the mail so please enable to voicemail function of your phone.  If you don't hear from us in two weeks, please give us a call to verify your results. Otherwise, we look forward to seeing you again at your next visit. If you have any questions or concerns before then, please call the clinic at [phone].   Please bring all your medications to every doctors visit   Sign up for My Chart to have easy access to your labs results, and communication with your Primary care physician.     Please check-out at the front desk before leaving the clinic.     Best,  Dr. Marthenia RollingScott Mercie Balsley FAMILY MEDICINE RESIDENT - PGY2 12/03/2017 4:29 PM

## 2017-12-03 NOTE — Progress Notes (Signed)
dip 

## 2017-12-05 ENCOUNTER — Encounter: Payer: Self-pay | Admitting: Family Medicine

## 2017-12-05 DIAGNOSIS — Z3202 Encounter for pregnancy test, result negative: Secondary | ICD-10-CM | POA: Insufficient documentation

## 2017-12-05 DIAGNOSIS — R300 Dysuria: Secondary | ICD-10-CM | POA: Insufficient documentation

## 2017-12-05 LAB — CERVICOVAGINAL ANCILLARY ONLY
CHLAMYDIA, DNA PROBE: NEGATIVE
NEISSERIA GONORRHEA: NEGATIVE
Trichomonas: NEGATIVE

## 2017-12-05 NOTE — Progress Notes (Signed)
    Subjective:  Andrea Stokes is a 27 y.o. female who [facility] with a chief complaint of wanting pregnancy test and STD screen.   HPI: Patient with 2 terminated pregnancies this calendar year presents with request for pregnancy test and STD screen.  Most resent was ~3354month ago and was out of the [facility] so it is not showing in Epic. She says she is safe at home, has not had any belly/pelvic pain but has had sensation of incomplete void for a few days.  There has been no other urinary symptoms, painless mild white discharge, and no bleeding.  She denies febrile symptoms.  Patient elects for urinary/selfswab testing and declines GU exam   Objective:  Physical Exam: BP 108/74   Pulse 90   Temp 98.4 F (36.9 C) (Oral)   Ht 5' (1.524 m)   Wt 170 lb 3.2 oz (77.2 kg)   LMP 08/02/2017   SpO2 99%   Breastfeeding? Unknown   BMI 33.24 kg/m   Gen: NAD, resting comfortably CV: RRR with no murmurs appreciated Pulm: NWOB, CTAB with no crackles, wheezes, or rhonchi GI: Normal bowel sounds present. Soft, some mild tenderness over bladder but no palpable fullness, Nondistended. MSK: no edema, cyanosis, or clubbing noted Skin: warm, dry Neuro: grossly normal, moves all extremities Psych: Normal affect and thought content  Results for orders placed or performed in visit on 12/03/17 (from the past 72 hour(s))  Cervicovaginal ancillary only     Status: None   Collection Time: 12/03/17 12:00 AM  Result Value Ref Range   Chlamydia Negative     Comment: Normal Reference Range - Negative   Neisseria gonorrhea Negative     Comment: Normal Reference Range - Negative   Trichomonas Negative     Comment: Normal Reference Range - Negative  POCT urinalysis dipstick     Status: Abnormal   Collection Time: 12/03/17  4:00 PM  Result Value Ref Range   Color, UA yellow yellow   Clarity, UA cloudy (A) clear   Glucose, UA negative negative mg/dL   Bilirubin, UA negative negative   Ketones, POC UA negative negative mg/dL   Spec Grav, UA >=1.610>=1.030 (A) 1.010 - 1.025   Blood, UA moderate (A) negative   pH, UA 6.0 5.0 - 8.0   Protein Ur, POC =30 (A) negative mg/dL   Urobilinogen, UA 1.0 0.2 or 1.0 E.U./dL   Nitrite, UA Negative Negative   Leukocytes, UA Large (3+) (A) Negative  POCT urine pregnancy     Status: None   Collection Time: 12/03/17  4:00 PM  Result Value Ref Range   Preg Test, Ur Negative Negative     Assessment/Plan:  Discharge from the vagina Minimal white/painless per patient.  She elects for self-swab and decline GU exam  Trich/G/C all negative and results sent to patient via mychart as requested  Dysuria Sensation of incomplete void, so significant pain on urination  UA with large leuk, protein and hematuria (patient with termination of pregnancy ~9354month ago).  Will treat with keflex with return precautions.  Patient advised to return when not spotting to retest urine.  Upreg neg  Pregnancy examination or test, negative result upreg neg, patient aware   Marthenia RollingScott Bao Coreas, DO FAMILY MEDICINE RESIDENT - PGY2 12/05/2017 7:05 AM

## 2017-12-05 NOTE — Assessment & Plan Note (Signed)
Sensation of incomplete void, so significant pain on urination  UA with large leuk, protein and hematuria (patient with termination of pregnancy ~523month ago).  Will treat with keflex with return precautions.  Patient advised to return when not spotting to retest urine.  Upreg neg

## 2017-12-05 NOTE — Assessment & Plan Note (Signed)
upreg neg, patient aware

## 2017-12-05 NOTE — Assessment & Plan Note (Signed)
Minimal white/painless per patient.  She elects for self-swab and decline GU exam  Trich/G/C all negative and results sent to patient via mychart as requested

## 2017-12-10 ENCOUNTER — Other Ambulatory Visit: Payer: Self-pay

## 2017-12-10 ENCOUNTER — Encounter (HOSPITAL_COMMUNITY): Payer: Self-pay | Admitting: *Deleted

## 2017-12-10 ENCOUNTER — Emergency Department (HOSPITAL_COMMUNITY)
Admission: EM | Admit: 2017-12-10 | Discharge: 2017-12-10 | Disposition: A | Discharge status: Home / Self Care | Payer: Medicaid Other | Attending: Emergency Medicine | Admitting: Emergency Medicine

## 2017-12-10 DIAGNOSIS — K0889 Other specified disorders of teeth and supporting structures: Secondary | ICD-10-CM | POA: Diagnosis not present

## 2017-12-10 DIAGNOSIS — Z79899 Other long term (current) drug therapy: Secondary | ICD-10-CM | POA: Insufficient documentation

## 2017-12-10 DIAGNOSIS — F17210 Nicotine dependence, cigarettes, uncomplicated: Secondary | ICD-10-CM | POA: Insufficient documentation

## 2017-12-10 MED ORDER — PENICILLIN V POTASSIUM 500 MG PO TABS: 500.0000 mg | ORAL_TABLET | Freq: Four times a day (QID) | ORAL | 0 refills | Status: AC

## 2017-12-10 MED ORDER — OXYCODONE-ACETAMINOPHEN 5-325 MG PO TABS: 2.0000 | ORAL_TABLET | ORAL | 0 refills | Status: AC | PRN

## 2017-12-10 MED ORDER — PENICILLIN V POTASSIUM 250 MG PO TABS
500.0000 mg | ORAL_TABLET | Freq: Once | ORAL | Status: AC
  Administered 2017-12-10: 500 mg via ORAL
  Filled 2017-12-10: qty 2

## 2017-12-10 MED ORDER — OXYCODONE-ACETAMINOPHEN 5-325 MG PO TABS
1.0000 | ORAL_TABLET | Freq: Once | ORAL | Status: AC
  Administered 2017-12-10: 1 via ORAL
  Filled 2017-12-10: qty 1

## 2017-12-10 NOTE — ED Notes (Signed)
Patient educated about not driving or performing other critical tasks (such as operating heavy machinery, caring for infant/toddler/child) due to sedative nature of narcotic medications received while in the ED.  Pt/caregiver verbalized understanding. Pt states she is ubering

## 2017-12-10 NOTE — ED Triage Notes (Signed)
Pt reports pain in the right jaw for 3 days.

## 2017-12-10 NOTE — ED Provider Notes (Signed)
[facility] EMERGENCY DEPARTMENT Provider Note   CSN: [id number] Arrival date & time: 12/10/17  2215     History   Chief Complaint Chief Complaint  Patient presents with  . Jaw Pain    HPI Andrea Stokes is a 27 y.o. female who presents to ED for evaluation of 2-day history of right lower dental pain.  States the symptoms began when she woke up.  She has been taking over-the-counter pain reliever with improvement in her symptoms.  She has not seen a dentist in several years.  Cannot recall any inciting event that may have triggered the symptoms.  States that the pain is sharp and radiates to her head.  Denies any neck pain, fever, drainage from area, trouble breathing or trouble swallowing, erythema.  HPI  Past Medical History:  Diagnosis Date  . HSV-2 (herpes simplex virus 2) infection   . Hx of chlamydia infection     Patient Active Problem List   Diagnosis Date Noted  . Pregnancy examination or test, negative result 12/05/2017  . Dysuria 12/05/2017  . Depression affecting pregnancy, antepartum 05/04/2017  . Nausea vomiting and diarrhea 05/04/2017  . Herpes genitalia 05/14/2016  . Complication of intrauterine device (IUD) (HCC) 04/08/2014  . Trichomonas vaginalis (TV) infection 06/28/2013  . BMI 31.0-31.9,adult 06/28/2013  . Discharge from the vagina 08/12/2011    Past Surgical History:  Procedure Laterality Date  . CESAREAN SECTION    . IUD REMOVAL N/A 04/08/2014   Procedure: INTRAUTERINE DEVICE (IUD) REMOVAL;  Surgeon: Tereso Newcomer, MD;  Location: [facility];  Service: Gynecology;  Laterality: N/A;     OB History    Gravida  7   Para  3   Term  3   Preterm  0   AB  3   Living  3     SAB  2   TAB  1   Ectopic  0   Multiple  0   Live Births  3            Home Medications    Prior to Admission medications   Medication Sig Start Date End Date Taking? Authorizing Provider  cephALEXin (KEFLEX) 500 MG capsule Take 1  capsule (500 mg total) by mouth 2 (two) times daily for 7 days. 12/03/17 12/10/17  Marthenia Rolling, DO  metroNIDAZOLE (FLAGYL) 500 MG tablet Take 1 tablet (500 mg total) by mouth 2 (two) times daily. 09/23/17   Poe, Deirdre C, CNM  ondansetron (ZOFRAN ODT) 4 MG disintegrating tablet Take 1 tablet (4 mg total) by mouth every 8 (eight) hours as needed for nausea or vomiting. 05/28/17   Rasch, Victorino Dike I, NP  oxyCODONE-acetaminophen (PERCOCET/ROXICET) 5-325 MG tablet Take 2 tablets by mouth every 4 (four) hours as needed for severe pain. 12/10/17   Kenishia Plack, PA-C  penicillin v potassium (VEETID) 500 MG tablet Take 1 tablet (500 mg total) by mouth 4 (four) times daily for 7 days. 12/10/17 12/17/17  Islam Villescas, Hillary Bow, PA-C  prenatal vitamin w/FE, FA (PRENATAL 1 + 1) 27-1 MG TABS tablet Take 1 tablet by mouth daily. 09/23/17   Poe, Deirdre C, CNM  sertraline (ZOLOFT) 50 MG tablet Take 1 tablet (50 mg total) by mouth daily. 05/04/17   Raelyn Mora, CNM    Family History Family History  Problem Relation Age of Onset  . Diabetes Mother   . Diabetes Father     Social History Social History   Tobacco Use  . Smoking status: Current  Every Day Smoker    Packs/day: 0.25    Types: Cigarettes  . Smokeless tobacco: Never Used  Substance Use Topics  . Alcohol use: No    Alcohol/week: 0.0 standard drinks    Frequency: Never  . Drug use: No     Allergies   Patient has no known allergies.   Review of Systems Review of Systems  Constitutional: Negative for chills and fever.  HENT: Positive for dental problem. Negative for drooling, facial swelling, postnasal drip, sore throat and trouble swallowing.   Musculoskeletal: Negative for neck pain and neck stiffness.     Physical Exam Updated Vital Signs BP 130/84   Pulse 91   Temp 99 F (37.2 C)   Resp 16   LMP 08/02/2017   SpO2 100%   Physical Exam  Constitutional: She appears well-developed and well-nourished. No distress.  Nontoxic-appearing and in  no acute distress.  Speaking complete sentences without difficulty.  HENT:  Head: Normocephalic and atraumatic.  Mouth/Throat: Oropharynx is clear and moist. She does not have dentures. No oral lesions. No trismus in the jaw. Abnormal dentition. Dental caries present. No dental abscesses, uvula swelling or lacerations. No tonsillar exudate.    Tenderness to palpation of the indicated tooth with no gross dental abscess or site of drainage at this time. No facial, neck or cheek swelling noted. No pooling of secretions or trismus.  Normal voice noted with no difficulty swallowing or breathing.  No submandibular erythema, edema or crepitus noted.  Eyes: Conjunctivae and EOM are normal. No scleral icterus.  Neck: Normal range of motion.  Pulmonary/Chest: Effort normal. No respiratory distress.  Neurological: She is alert.  Skin: No rash noted. She is not diaphoretic.  Psychiatric: She has a normal mood and affect.  Nursing note and vitals reviewed.    ED Treatments / Results  Labs (all labs ordered are listed, but only abnormal results are displayed) Labs Reviewed - No data to display  EKG None  Radiology No results found.  Procedures Procedures (including critical care time)  Medications Ordered in ED Medications  penicillin v potassium (VEETID) tablet 500 mg (has no administration in time range)  oxyCODONE-acetaminophen (PERCOCET/ROXICET) 5-325 MG per tablet 1 tablet (has no administration in time range)     Initial Impression / Assessment and Plan / ED Course  I have reviewed the triage vital signs and the nursing notes.  Pertinent labs & imaging results that were available during my care of the patient were reviewed by me and considered in my medical decision making (see chart for details).     Patient with dentalgia. On exam, there is no evidence of a drainable abscess. No trismus, glossal elevation, unilateral tonsillar swelling. No evidence of retropharyngeal or  peritonsillar abscess or Ludwig angina. Will treat with  penicillin and short course of pain medication. Pt instructed to follow-up with dentist as soon as possible. Resource guide provided with AVS. [place] PMP reviewed with no discrepancies.  Portions of this note were generated with Scientist, clinical (histocompatibility and immunogenetics)Dragon dictation software. Dictation errors may occur despite best attempts at proofreading.  Final Clinical Impressions(s) / ED Diagnoses   Final diagnoses:  Pain, dental    ED Discharge Orders         Ordered    penicillin v potassium (VEETID) 500 MG tablet  4 times daily     12/10/17 2252    oxyCODONE-acetaminophen (PERCOCET/ROXICET) 5-325 MG tablet  Every 4 hours PRN     12/10/17 2252  Dietrich PatesKhatri, Jlyn Bracamonte, PA-C 12/10/17 2256    Mancel BaleWentz, Elliott, MD 12/10/17 [phone]

## 2017-12-10 NOTE — Discharge Instructions (Signed)
Return to ED for worsening symptoms, trouble breathing or trouble swallowing, trouble moving your neck, increased facial swelling, chest pain.

## 2018-04-06 ENCOUNTER — Emergency Department
Admission: EM | Admit: 2018-04-06 | Discharge: 2018-04-07 | Disposition: A | Discharge status: Home / Self Care | Payer: 59 | Attending: Student in an Organized Health Care Education/Training Program | Admitting: Student in an Organized Health Care Education/Training Program

## 2018-04-06 DIAGNOSIS — L02214 Cutaneous abscess of groin: Secondary | ICD-10-CM | POA: Insufficient documentation

## 2018-04-06 DIAGNOSIS — F17210 Nicotine dependence, cigarettes, uncomplicated: Secondary | ICD-10-CM | POA: Insufficient documentation

## 2018-04-07 MED ORDER — CEPHALEXIN 500 MG PO CAPS
500.0000 mg | ORAL_CAPSULE | Freq: Once | ORAL | Status: AC
Start: 2018-04-07 — End: 2018-04-07
  Administered 2018-04-07: 500 mg via ORAL
  Filled 2018-04-07: qty 1

## 2018-04-07 MED ORDER — SULFAMETHOXAZOLE-TRIMETHOPRIM 800-160 MG PO TABS
1.0000 | ORAL_TABLET | Freq: Once | ORAL | Status: AC
Start: 2018-04-07 — End: 2018-04-07
  Administered 2018-04-07: 1 via ORAL
  Filled 2018-04-07: qty 1

## 2018-04-07 MED ORDER — OXYCODONE-ACETAMINOPHEN 5-325 MG PO TABS
1.0000 | ORAL_TABLET | Freq: Once | ORAL | Status: AC
Start: 2018-04-07 — End: 2018-04-07
  Administered 2018-04-07: 1 via ORAL
  Filled 2018-04-07: qty 1

## 2018-04-07 MED ORDER — CEPHALEXIN 500 MG PO CAPS
500.0000 mg | ORAL_CAPSULE | Freq: Three times a day (TID) | ORAL | 0 refills | Status: AC
Start: 2018-04-07 — End: 2018-04-14

## 2018-04-07 MED ORDER — SULFAMETHOXAZOLE-TRIMETHOPRIM 800-160 MG PO TABS
1.0000 | ORAL_TABLET | Freq: Two times a day (BID) | ORAL | 0 refills | Status: AC
Start: 2018-04-07 — End: 2018-04-14

## 2018-04-07 NOTE — ED Notes (Addendum)
PA draining abscess at pt's bedside.

## 2018-04-07 NOTE — Discharge Instructions (Signed)
Abscess    You were diagnosed with an abscess of the:   Skin.    An abscess is a sore that is infected and filled with pus. It is caused by an infection with bacteria. Sometimes a splinter or other object stuck in the skin can cause an infection that can become an abscess. The body traps the infection in a tight pocket to try to stop the infection from spreading to other areas. The usual treatment is to make a cut in the abscess so the pus can drain out. Most abscesses heal quickly with no need for antibiotics.    Your doctor has determined that antibiotics ARE necessary to treat your abscess. Fill the prescription and take all medications as prescribed until they are all gone.    A drain and/or packing have been placed in your abscess to help the wound heal. The packing in your wound will need to be changed. This can be done by your family doctor, a referral doctor, this facility or the nearest Emergency Department. Your doctor will set up a plan for you to have the packing changed. Leave the drain and packing in place until you see a doctor. The drain might fall out on its own. If this happens, cover the abscess with a clean dressing (bandage) and follow up with a doctor as scheduled. Until the packing is removed, don't soak the wound in water, like in a bathtub or pool. Short showers or sponge baths are okay. Keep the wound as dry and clean as possible.    YOU SHOULD SEEK MEDICAL ATTENTION IMMEDIATELY, EITHER HERE OR AT THE NEAREST EMERGENCY DEPARTMENT, IF ANY OF THE FOLLOWING OCCURS:   You see unusual redness or swelling.   You see red streaks on the arm or leg.   The wound or drainage smells bad.   You have fever (temperature higher than 100.4F / 38C), chills, worse pain or swelling.     Thank you for choosing [facility] for your emergency care needs.  We strive to provide EXCELLENT care to you and your family.      If you do not continue to improve or your condition worsens, please  contact your doctor or return immediately to the Emergency Department.    ONSITE PHARMACY  Our full service onsite pharmacy is a 2 minute walk from the ER.  Open Mon to Fri from 8 am to 8 pm, Sat and Sun 9 am to 5 pm. Ask an ED staff member for directions.  We accept all major insurances and prices are competitive with major retailers.  Ask your provider to print your prescriptions down to the pharmacy to speed you on your way home.      DOCTOR REFERRALS  Call [phone] (available 24 hours a day, 7 days a week) if you need any further referrals and we can help you find a primary care doctor or specialist.  Also, available online at:  [email or web address]    YOUR CONTACT INFORMATION  Before leaving please check with registration to make sure we have an up-to-date contact number.  You can call registration at [phone] to update your information.  For questions about your hospital bill, please call [phone].  For questions about your Emergency Dept Physician bill please call [phone].      FREE HEALTH SERVICES  If you need help with health or social services, please call 2-1-1 for a free referral to resources in your area.  2-1-1 is   a free service connecting people with information on health insurance, free clinics, pregnancy, mental health, dental care, food assistance, housing, and substance abuse counseling.  Also, available online at:  http://www.[facility].org    MEDICAL RECORDS AND TESTS  Certain laboratory test results do not come back the same day, for example urine cultures.   We will contact you if other important findings are noted.  Radiology films are often reviewed again to ensure accuracy.  If there is any discrepancy, we will notify you.      Please call [phone] to pick up a complimentary CD of any radiology studies performed.  If you or your doctor would like to request a copy of your medical records, please call [phone].          ORTHOPEDIC INJURY    Please know that significant injuries can exist even when an initial x-ray is read as normal or negative.  This can occur because some fractures (broken bones) are not initially visible on x-rays.  For this reason, close outpatient follow-up with your primary care doctor or bone specialist (orthopedist) is required.    MEDICATIONS AND FOLLOWUP  Please be aware that some prescription medications can cause drowsiness.  Use caution when driving or operating machinery.    The examination and treatment you have received in our Emergency Department is provided on an emergency basis, and is not intended to be a substitute for your primary care physician.  It is important that your doctor checks you again and that you report any new or remaining problems at that time.      24 HOUR PHARMACIES  [facility] - [address], [place], [place] [address] (1.4 miles, 7 minutes)  Handout with directions available on request      ASSISTANCE WITH INSURANCE    Affordable Care Act  (ACA)  Call to start or finish an application, compare plans, enroll or ask a question.  1-800-318-2596  TTY: 1-855-889-4325  Web:  Healthcare.gov    Help Enrolling in Medicaid  Cover [place]  (855) 242-8282 (TOLL-FREE)  (888) 221-1590 (TTY)  Web:  Http://www.coverva.org    Local Help Enrolling in the ACA  [facility]  ([phone] 748-2580 (MAIN)  Email:  health-help@nvfs.org  Web:  Http://www.nvfs.org  Address:  [address], [address] [place], [place] [address]

## 2018-04-08 NOTE — ED Provider Notes (Signed)
Physician/Midlevel provider first contact with patient: 04/06/18 2354         EMERGENCY DEPARTMENT HISTORY AND PHYSICAL EXAM    Patient Name: Amber Hughes  Patient DOB:  June 23, 1990  MRN:  [id number]  Room:  04/A04  Rendering Provider: Tresea Mall, PA-C    History of Presenting Illness     Chief Complaint:   Chief Complaint   Patient presents with   . Abscess     Mechanism of Injury:       Historian:  Patient   Onset:   3 days ago   Severity:  Mild      27 y.o. female presents with left groin abscess. Pt states 3 days ago she noticed small area of pain and swelling to left groin. Pain similar to previous abscesses. No drainage. Taking ibuprofen at home without improvement, last dose around 1900. Pt has needed I&D in the past and concerned she needs one today. Pain worse with ambulating and pt had a hard time getting through work today. She reports some chills and subjective fever earlier today, but doesn't feel feverish now.     PMD:  Pcp, None, MD    Past Medical History     History reviewed. No pertinent past medical history.    Past Surgical History     Past Surgical History:   Procedure Laterality Date   . CESAREAN SECTION      x2       Family History     History reviewed. No pertinent family history.    Social History     Social History     Socioeconomic History   . Marital status: Single     Spouse name: Not on file   . Number of children: Not on file   . Years of education: Not on file   . Highest education level: Not on file   Occupational History   . Not on file   Social Needs   . Financial resource strain: Not on file   . Food insecurity:     Worry: Not on file     Inability: Not on file   . Transportation needs:     Medical: Not on file     Non-medical: Not on file   Tobacco Use   . Smoking status: Current Every Day Smoker     Packs/day: 0.50   . Smokeless tobacco: Never Used   Substance and Sexual Activity   . Alcohol use: Never     Frequency: Never   . Drug use: Never   . Sexual activity: Not on file    Lifestyle   . Physical activity:     Days per week: Not on file     Minutes per session: Not on file   . Stress: Not on file   Relationships   . Social connections:     Talks on phone: Not on file     Gets together: Not on file     Attends religious service: Not on file     Active member of club or organization: Not on file     Attends meetings of clubs or organizations: Not on file     Relationship status: Not on file   . Intimate partner violence:     Fear of current or ex partner: Not on file     Emotionally abused: Not on file     Physically abused: Not on file     Forced sexual activity: Not on file  Other Topics Concern   . Not on file   Social History Narrative   . Not on file       Allergies     No Known Allergies    Home Medications     Home medications reviewed by PA at 3:19 AM    No current facility-administered medications for this encounter.     Current Outpatient Medications:   .  cephalexin (KEFLEX) 500 MG capsule, Take 1 capsule (500 mg total) by mouth 3 (three) times daily for 7 days, Disp: 21 capsule, Rfl: 0  .  sulfamethoxazole-trimethoprim (BACTRIM DS,SEPTRA DS) 800-160 MG per tablet, Take 1 tablet by mouth 2 (two) times daily for 7 days, Disp: 14 tablet, Rfl: 0    ED Medications Administered     ED Medication Orders (From admission, onward)    Start Ordered     Status Ordering Provider    04/07/18 0054 04/07/18 0053  sulfamethoxazole-trimethoprim (BACTRIM DS,SEPTRA DS) 800-160 MG per tablet 1 tablet  Once     Route: Oral  Ordered Dose: 1 tablet     Last MAR action:  Given Tresea Mall SIMONS    04/07/18 0054 04/07/18 0053  cephalexin (KEFLEX) capsule 500 mg  Once     Route: Oral  Ordered Dose: 500 mg     Last MAR action:  Given Tresea Mall SIMONS    04/07/18 0029 04/07/18 0028  oxyCODONE-acetaminophen (PERCOCET) 5-325 MG per tablet 1 tablet  Once     Route: Oral  Ordered Dose: 1 tablet     Last MAR action:  Given Kymberlee Viger SIMONS          Review of Systems     Constitutional: Positive  for subjective fever and chills.   Respiratory: Negative for shortness of breath.   Cardiovascular: Negative for chest pain.   Gastrointestinal: Negative for abdominal pain, nausea, vomiting  Skin: Positive for abscess.    Neurological: Negative for headache or dizziness.   All other systems reviewed and negative     VS     No data found.    Physical Exam     Constitutional: Vital signs reviewed. Well appearing.  Head: Normocephalic, atraumatic  Eyes: Conjunctiva and sclera are normal.  No injection or discharge.  Neck: Normal range of motion. Trachea midline.  Respiratory/Chest: No respiratory distress.   Abd: Soft, non tender. No distension. No rebound or guarding.   GU: 4cm x 2 cm area of induration to left inguinal fold. No erythema. No fluctuation.   Neurological: No focal motor deficits by observation. Speech normal.  Skin: Warm and dry. No rash.  Psychiatric: Alert and conversant.  Normal affect.  Normal insight.     Data Review     Nursing notes reviewed and agree: Yes    Diagnostic Study Results     Labs:     Results     ** No results found for the last 24 hours. **          Radiologic Studies:  Radiology Results (24 Hour)     ** No results found for the last 24 hours. **      .    EKG:      Monitor:      Procedures     Incision and Drainage:  Verbal consent obtained  Performed by Tresea Mall, PA-C  Indication: Cutaneous Abscess  Bedside US utilized for confirmation of abscess.   No contraindications  Anesthesia: Lido 2% without epi via local injection  Location: Left inguinal fold   Incision made over area of fluctuance with #11 blade scalpel   Area explored for loculations  Packed with sterile gauze: 1/4"packing  Drained:moderate amount of purulent discharge.   Dressing placed  Minimal blood loss, bleeding controlled after procedure.   Neurovascular status normal after procedure  No complications, patient tolerated procedure well.      MDM and Clinical Notes     Pt presents with abscess, fluid collection  verified with bedside US. Successful I&D with improvement of pain after procedure. Area of induration larger than area of abscess, will cover with antibiotics as well. Packing removal in 48 hours with surgeon or return to ED. Return instructions given. Pt advised to return to ER for worsening symptoms or other concerns. Pt ambulated from dept without difficulty upon discharge in no distress.          Diagnosis and Disposition     Clinical Impression  1. Groin abscess        Disposition  ED Disposition     ED Disposition Condition Date/Time Comment    Discharge  Tue Apr 07, 2018 12:54 AM Allen Norris discharge to home/self care.    Condition at disposition: Stable          Prescriptions    Discharge Medication List as of 04/07/2018 12:54 AM      START taking these medications    Details   cephalexin (KEFLEX) 500 MG capsule Take 1 capsule (500 mg total) by mouth 3 (three) times daily for 7 days, Starting Tue 04/07/2018, Until Tue 04/14/2018, Print      sulfamethoxazole-trimethoprim (BACTRIM DS,SEPTRA DS) 800-160 MG per tablet Take 1 tablet by mouth 2 (two) times daily for 7 days, Starting Tue 04/07/2018, Until Tue 04/14/2018, Print                Berna Bue [place], [place]  04/08/18 0329       Rosamaria Lints, MD  04/10/18 2324

## 2018-12-05 ENCOUNTER — Emergency Department
Admission: EM | Admit: 2018-12-05 | Discharge: 2018-12-05 | Disposition: A | Discharge status: Home / Self Care | Payer: 59 | Attending: Emergency Medicine | Admitting: Emergency Medicine

## 2018-12-05 DIAGNOSIS — L02411 Cutaneous abscess of right axilla: Secondary | ICD-10-CM | POA: Insufficient documentation

## 2018-12-05 DIAGNOSIS — F17210 Nicotine dependence, cigarettes, uncomplicated: Secondary | ICD-10-CM | POA: Insufficient documentation

## 2018-12-05 MED ORDER — SULFAMETHOXAZOLE-TRIMETHOPRIM 800-160 MG PO TABS
1.0000 | ORAL_TABLET | Freq: Once | ORAL | Status: AC
Start: 2018-12-05 — End: 2018-12-05
  Administered 2018-12-05: 1 via ORAL
  Filled 2018-12-05: qty 1

## 2018-12-05 MED ORDER — OXYCODONE-ACETAMINOPHEN 5-325 MG PO TABS
2.0000 | ORAL_TABLET | Freq: Once | ORAL | Status: AC
Start: 2018-12-05 — End: 2018-12-05
  Administered 2018-12-05: 2 via ORAL
  Filled 2018-12-05: qty 2

## 2018-12-05 MED ORDER — SULFAMETHOXAZOLE-TRIMETHOPRIM 800-160 MG PO TABS
1.0000 | ORAL_TABLET | Freq: Two times a day (BID) | ORAL | 0 refills | Status: AC
Start: 2018-12-05 — End: 2018-12-12

## 2018-12-05 NOTE — ED Provider Notes (Signed)
EMERGENCY DEPARTMENT HISTORY AND PHYSICAL EXAM      [facility]       Patient Information     Patient Name: Amber Hughes, Amber Hughes  Encounter Date:  12/05/2018  Patient DOB:  26-May-1990  MRN:  [id number]  Room:  33/SS 33  Rendering Provider: Jimmy Picket FNP-BC     History of Presenting Illness     Chief Complaint: R axilla  Historian: self  Onset: 2 weeks ago  Quality: aching  Location: R axilla  Duration: itermittent      HPI Comments:   28 y.o. female Patient with pertinent medical history of multiple abscesses in the past.  Patient presents emergency with right axilla pain, edema, tenderness.  Patient reports 2 weeks ago she had an abscess that responded well to 1 day of oral antibiotics with unknown name.  Patient reports last week infection came back patient took another day of antibiotic.  Patient reports since yesterday she is having even more pain and swelling and difficulty moving right upper arm.  No fevers.  No spontaneous drainage.  Patient reports shaving her armpits daily.Patient seeking to have I&D today.      PMD: Pcp, None, MD    Past Medical History     History reviewed. No pertinent past medical history.    Past Surgical History     Past Surgical History:   Procedure Laterality Date   . CESAREAN SECTION      x2       Family History     History reviewed. No pertinent family history.    Social History     Social History     Socioeconomic History   . Marital status: Single     Spouse name: Not on file   . Number of children: Not on file   . Years of education: Not on file   . Highest education level: Not on file   Occupational History   . Not on file   Social Needs   . Financial resource strain: Not on file   . Food insecurity     Worry: Not on file     Inability: Not on file   . Transportation needs     Medical: Not on file     Non-medical: Not on file   Tobacco Use   . Smoking status: Current Every Day Smoker     Packs/day: 0.50     Types: Cigarettes   . Smokeless tobacco: Never Used    Substance and Sexual Activity   . Alcohol use: Never     Frequency: Never   . Drug use: Never   . Sexual activity: Not on file   Lifestyle   . Physical activity     Days per week: Not on file     Minutes per session: Not on file   . Stress: Not on file   Relationships   . Social Wellsite geologist on phone: Not on file     Gets together: Not on file     Attends religious service: Not on file     Active member of club or organization: Not on file     Attends meetings of clubs or organizations: Not on file     Relationship status: Not on file   . Intimate partner violence     Fear of current or ex partner: Not on file     Emotionally abused: Not on file     Physically abused: Not  on file     Forced sexual activity: Not on file   Other Topics Concern   . Not on file   Social History Narrative   . Not on file       Allergies     No Known Allergies    Home Medications     Prior to Admission medications    Not on File         Review of Systems     Review of Systems   Constitutional: Negative for chills, fever and malaise/fatigue.   HENT: Negative for ear discharge and ear pain.    Eyes: Negative for pain and discharge.   Respiratory: Negative for cough and shortness of breath.    Cardiovascular: Negative for chest pain and palpitations.   Gastrointestinal: Negative for abdominal pain, blood in stool, diarrhea, nausea and vomiting.   Genitourinary: Negative for dysuria, flank pain and hematuria.   Musculoskeletal: Negative for back pain, joint pain, myalgias and neck pain.   Skin: Negative for rash.        R axilla abscess   Neurological: Negative for dizziness, speech change, focal weakness and loss of consciousness.   Endo/Heme/Allergies: Does not bruise/bleed easily.   Psychiatric/Behavioral: Negative for suicidal ideas.        Physical Exam     Patient Vitals for the past 24 hrs:   BP Temp Temp src Pulse Resp SpO2 Weight   12/05/18 2017 127/81 98.6 F (37 C) Oral 86 16 99 % -   12/05/18 1806 - - - - 18 - -    12/05/18 1758 138/90 - - 96 - 98 % -   12/05/18 1756 - 98.3 F (36.8 C) Temporal - - - 86.2 kg       Nursing note and vitals reviewed.  Constitutional:  Well developed, well nourished. Awake & Oriented x3.  Head:  Atraumatic. Normocephalic.     Cardiovascular:  Regular rate. Regular rhythm. No murmurs, rubs, or gallops.  Pulmonary/Chest:  No evidence of respiratory distress. Clear to auscultation bilaterally.  No wheezing, rales or rhonchi.   Abdominal:  Soft and non-distended. There is no tenderness. No rebound, guarding, or rigidity.  Back:  Full ROM. Nontender.  Extremities:  No edema. No cyanosis. No clubbing. Full range of motion in all extremities.  Skin:  Skin is warm and dry.  No diaphoresis. No rash. Right axilla with 3 cm in diameter area of edema erythema and induration with fluctuance and ttp, no rash no spontaneous drainage   Neurological:  Alert, awake, and appropriate. Normal speech. Motor normal.  Psychiatric:  Good eye contact. Normal interaction, affect, and behavior.    PROCEDURE: INCISION & DRAINAGE:    Performed by the emergency provider  Indication: Abscess  Location:  R axilla  Preparation: The area was prepped and draped in the usual sterile fashion and was cleansed with iodinex3.  Local infiltration of Lidocaine 1% with Epi was used for anesthesia.   Procedure:  The most fluctuant portion of the abscess was incised with a #11 scalpel.   Approximately 5 mL of pus was obtained.  The abscess was packed w packing strip.  A dressing was applied by the RN.    Post-Procedure:  On exam the abscess is notably less fluctuant.  The patient tolerated the procedure well, and there were no complications.  Cultured: yes    Orders Placed During This Encounter     Orders Placed This Encounter   Procedures   . Wound  Culture and Gram Stain       ED Medications Administered     ED Medication Orders (From admission, onward)    Start Ordered     Status Ordering Provider    12/05/18 1901 12/05/18 1900   oxyCODONE-acetaminophen (PERCOCET) 5-325 MG per tablet 2 tablet  Once     Route: Oral  Ordered Dose: 2 tablet     Last MAR action:  Given Cira Servant    12/05/18 1901 12/05/18 1900  sulfamethoxazole-trimethoprim (BACTRIM DS) 800-160 MG per tablet 1 tablet  Once     Route: Oral  Ordered Dose: 1 tablet     Last MAR action:  Given Chessie Neuharth M          Diagnostic Study Results and Data Review     The results of the diagnostic studies below were reviewed by the ED provider:    Labs  Results     ** No results found for the last 24 hours. **          Radiologic Studies  Radiology Results (24 Hour)     ** No results found for the last 24 hours. **            Abnormal results/incidental findings discussed with pt and/or family: yes    Monitors, EKG, Procedures, Critical Care, and Splints         MDM and Clinical Notes     Working Differential (not completely inclusive): abscess vs cellulitis vs hydradenitis, etc    Nursing records reviewed and agree: YES     Clinical Notes:     28 y.o.female with right axilla abscess, Hx of similar in the past  I&D as above with relief of sx , packed, covering with  Bactrim suspecting MRSA. Follow up in 2 days for wound check at [facility] or ED if unable to get appt, pt agreed  Tylenol/motrin for pain warm compresses    The patient is aware that this evaluation is only a screening for emergent conditions related to his or her symptoms and presentation. Discussed appropriate supportive care in addition to any prescriptions provided. Patient/Family were given opportunity to ask questions which were answered to the best of my ability. Patient/family were given strict return precautions and were agreeable to dispo plan.      Re-Eval: Re-eval at     Phone Conversation: n/a      Prescriptions       Discharge Medication List as of 12/05/2018  8:07 PM      START taking these medications    Details   sulfamethoxazole-trimethoprim (BACTRIM DS) 800-160 MG per tablet Take 1 tablet by mouth 2 (two)  times daily for 7 days, Starting Sat 12/05/2018, Until Sat 12/12/2018, Normal             Diagnosis and Disposition     Clinical Impression:  1. Abscess of right axilla      Final diagnoses:   Abscess of right axilla       Disposition:  ED Disposition     ED Disposition Condition Date/Time Comment    Discharge  Sat Dec 05, 2018  8:07 PM Amber Hughes discharge to home/self care.    Condition at disposition: Stable                    Rendering Provider: Jimmy Picket FNP-BC     Attending's signature signifies review of the provider note and clinical impression.      This  note was generated by the [facility] EMR system/Dragon speech recognition and may contain inherent errors or omissions not intended by the user. Grammatical errors, random word insertions, deletions, pronoun errors and incomplete sentences are occasional consequences of this technology due to software limitations. Not all errors are caught or corrected. If there are questions or concerns about the content of this note or information contained within the body of this dictation they should be addressed directly with the author for clarification.         Joyce Heitman, Rhona Leavens, FNP  12/06/18 1252       Wilmer Floor, DO  12/08/18 1210

## 2018-12-05 NOTE — ED Triage Notes (Signed)
Pt reports abscess under right arm pit that has been there for 2 weeks that she has been trying to treat at home with no relief. Pt has had them before. Pt reports that it does feel warm. Denies fevers at home. Last abscess a couple months ago, seen here for drainage.

## 2019-01-30 ENCOUNTER — Encounter (INDEPENDENT_AMBULATORY_CARE_PROVIDER_SITE_OTHER): Payer: Self-pay | Admitting: Family

## 2019-01-30 NOTE — Progress Notes (Signed)
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  Pt is a single mom, recently moved here from [place], and is employed by [employer] - works in Office manager - goes to office.  Reports not in a relationship at this time but sex is comfortable.  Pt reports having sex with a prior partner once and desires STIs.    No other issues of concerns voiced.     HISTORY    GYN Hx  LMP:  01/27/19  Sexually Active: Not currently on a regular basis  Birth Control:  Sometimes condoms  Menses:   11/3-4/monthly  Last Pap:  2019 - reports negative (in [place])  Hx Abnormal Paps:  None  Colposcopy:  N/A  Hx STDs:  Yes - 5 years ago - chlamydia   Last Mammogram:  N/A  Gardasil - Never had - pamphlet requested and given      OB Hx  OB History     Gravida   7    Para   3    Term   3    Preterm        AB   4    Living   3       SAB   2    TAB        Ectopic        Multiple        Live Births   3               C/S x 3 ( 3 children all boys - 14y.o., 71 y.o. and 10 y.o)      Allergies  No Known Allergies    Medications  No current outpatient medications on file.     No current facility-administered medications for this visit.        PMH  Past Medical History:   Diagnosis Date   . History of chlamydia            PSH  Past Surgical History:   Procedure Laterality Date   . CESAREAN SECTION      x2       SH  Social History     Socioeconomic History   . Marital status: Single     Spouse name: Not on file   . Number of children: Not on file   . Years of education: Not on file   . Highest education level: Not on file   Occupational History   . Not on file   Social Needs   . Financial resource strain: Not on file   . Food insecurity     Worry: Not on file     Inability: Not on file   . Transportation needs     Medical: Not on file     Non-medical: Not on file   Tobacco Use   . Smoking status: Current Every Day Smoker     Packs/day: 0.25     Types: Cigarettes   . Smokeless tobacco: Never Used   Substance and  Sexual Activity   . Alcohol use: Yes     Frequency: Never     Comment: socially   . Drug use: Never   . Sexual activity: Not Currently     Partners: Male     Birth control/protection: None   Lifestyle   . Physical activity     Days per week: Not on file  Minutes per session: Not on file   . Stress: Not on file   Relationships   . Social Wellsite geologist on phone: Not on file     Gets together: Not on file     Attends religious service: Not on file     Active member of club or organization: Not on file     Attends meetings of clubs or organizations: Not on file     Relationship status: Not on file   . Intimate partner violence     Fear of current or ex partner: Not on file     Emotionally abused: Not on file     Physically abused: Not on file     Forced sexual activity: Not on file   Other Topics Concern   . Not on file   Social History Narrative   . Not on file       FH  Family History   Problem Relation Age of Onset   . Diabetes Father    . Hypertension Father    . Diabetes Mother    . Hypertension Mother    . Breast cancer Neg Hx    . Colon cancer Neg Hx    . Ovarian cancer Neg Hx      ROS     Review of Systems -  General ROS: negative for fatigue, fever/chills, weight loss  Breast ROS: negative for breast lumps, nipple discharge  Gyn:  negative for abnormal discharge, abnormal bleeding, dyspareunia, pelvic pain, hot flashes/night sweats, vaginal dryness  Gastrointestinal ROS: no abdominal pain, change in bowel habits  Genitourinary ROS: no dysuria, trouble voiding, or hematuria  Skin: denies rash or lesion  Psych: denies depression  All other systems reviewed- negative      VITAL SIGNS  Vitals:    02/01/19 0906   BP: 117/80   Pulse: 87   Temp: 98.5 F (36.9 C)   Weight: 194 lb 6.4 oz (88.2 kg)   Height: 5' (1.524 m)       EXAM    Physical Exam:  Gen: Well appearing.  INAD  Psych:Mood congruent, alert and oriented x 3  Skin: no lesions, rash  Thyroid:  Nontender, no nodules, wnl  Breasts:  No skin  changes, no mass, no nipple discharge with expression, no supraclavicular, infraclavicular, axillary lymphadenopathy  Lungs: clear to auscultation bilaterally , no wheezes, rales, or rhonchi  CV:  Regular rate and rhythm,  no murmurs/rubs/gallops  Abd:  Soft/Nondistended/Nontender to palpation/normal active bowel sounds/no masses  Pelvic:  Normal external female genitalia               Vulva/vagina  without lesions, no abnormal discharge               Cervix no lesions, no cervical motion tenderness, no abnormal discharge               Uterus Normal size, shape, and contour/Anteverted/Nontender to palpation/mobile                                  Adnexa no mass/nontender to palpation bilaterally      ASSESSMENT  1. Screening for STDs (sexually transmitted diseases)    2. Well woman exam with routine gynecological exam        PLAN  Orders Placed This Encounter   Procedures   . Chlamydia/GC BY PCR   . Bacterial Vaginosis/Candida/Trich  PCR - [place]   . Hepatitis B (HBV) Surface Antigen   . Hepatitis C (HCV) antibody, Total   . HIV Ag/Ab 4th generation   . HSV Type 1 and 2 IgG   . Syphilis Screen IgG and IgM   . Hemolysis index     - Normal well woman exam   - PAP UTD  - BSE/BSA discussed and reviewed.   - Information given on gardasil vaccine.   - Condom use discussed and importance stressed in the prevention of STIs.   - exercise 150 min/week  - Healthy diet of green leafy vegetables, fruits, lean meats and healthy fats.    - PCP for routine health mainfenance  -F/U one year for well woman exam.

## 2019-02-01 ENCOUNTER — Ambulatory Visit (INDEPENDENT_AMBULATORY_CARE_PROVIDER_SITE_OTHER): Payer: 59 | Admitting: Family

## 2019-02-01 ENCOUNTER — Encounter (INDEPENDENT_AMBULATORY_CARE_PROVIDER_SITE_OTHER): Payer: Self-pay | Admitting: Family

## 2019-02-01 VITALS — BP 117/80 | HR 87 | Temp 98.5°F | Ht 60.0 in | Wt 194.4 lb

## 2019-02-01 DIAGNOSIS — Z01419 Encounter for gynecological examination (general) (routine) without abnormal findings: Secondary | ICD-10-CM

## 2019-02-01 DIAGNOSIS — Z113 Encounter for screening for infections with a predominantly sexual mode of transmission: Secondary | ICD-10-CM

## 2019-02-01 LAB — HEMOLYSIS INDEX: Hemolysis Index: 0 (ref 0–18)

## 2019-02-01 LAB — HIV AG/AB 4TH GENERATION: HIV Ag/Ab, 4th Generation: NONREACTIVE

## 2019-02-01 LAB — HEPATITIS C ANTIBODY: Hepatitis C, AB: NONREACTIVE

## 2019-02-01 LAB — HEPATITIS B SURFACE ANTIGEN W/ REFLEX TO CONFIRMATION: Hepatitis B Surface Antigen: NONREACTIVE

## 2019-02-01 LAB — SYPHILIS SCREEN IGG AND IGM: Syphilis Screen IgG and IgM: NONREACTIVE

## 2019-02-01 NOTE — Patient Instructions (Signed)
Breast Health: Breast Self-Awareness  What is breast self-awareness?  Breast self-awareness is knowing how your breasts normally look and feel. Your breasts change as you go through different stages of your life. So it's important to learn what is normal for your breasts. Knowing about your breasts helps you spot any changes in them right away. Tell your healthcare provider about any changes.  Why is breast self-awareness important?  Many experts now say that women should focus on breast self-awareness instead of doing a breast self-examination (BSE). These experts include the American Cancer Society and the American Congress of Obstetricians and Gynecologists. Some experts even advise not teaching women to do a BSE. That's because research hasn't shown a clear benefit to doing BSEs.  Breast self-awareness is different than a BSE. It isn't about following a certain method and schedule. It's about knowing what's normal for your breasts. That way you can spot even small changes right away. If you see any changes, tell your healthcare provider.  Changes to look for  Call your healthcare provider if you find any changes in your breasts that worry you. These changes may be:   A lump   Nipple discharge other than breastmilk, especially if it's bloody   Swelling   A change in size or shape   Skin changes, such as redness, thickening, or dimpling of the skin   Swollen lymph nodes in the armpit   Nipple problems, such as pain or redness  If you find a lump  Call your provider if you find lumpiness in one breast. Also call if you feel something different in the tissue or feel a definite lump. Sometimes lumpiness may be due to menstrual changes. But there may be reason for concern.  Your provider may want to see you right away if you have:   Nipple discharge that is bloody   Skin changes on your breast, such as dimpling or puckering  It's okay to be upset if you find a lump. Be sure to call your provider right away.  Remember that most breast lumps are benign. This means they are not cancer.  [employer] last reviewed this educational content on 11/28/2015   2000-2020 [employer] [address], [place] [address]. All rights reserved. This information is not intended as a substitute for professional medical care. Always follow your healthcare professional's instructions.

## 2019-02-02 LAB — BACTERIAL VAGINOSIS/CANDIDA/TRICH PCR - ~~LOC~~
Bacterial Vaginosis Marker DNA: DETECTED — AB
Candida Glabrata,DNA: NOT DETECTED
Candida Group DNA: NOT DETECTED
Candida krusei, DNA: NOT DETECTED
Trichomonas vaginalis, DNA: NOT DETECTED

## 2019-02-02 LAB — HSV TYPE 1 AND 2 ANTIBODY IGG
Herpes Antibody Type 1 IgG: POSITIVE
Herpes Antibody Type 2 IgG: POSITIVE

## 2019-02-03 LAB — CHLAMYDIA/NEISSERIA BY PCR
Chlamydia DNA by PCR: NEGATIVE
Neisseria gonorrhoeae by PCR: NEGATIVE

## 2019-02-09 ENCOUNTER — Other Ambulatory Visit (INDEPENDENT_AMBULATORY_CARE_PROVIDER_SITE_OTHER): Payer: Self-pay | Admitting: Family

## 2019-02-09 MED ORDER — METRONIDAZOLE 500 MG PO TABS
500.0000 mg | ORAL_TABLET | Freq: Two times a day (BID) | ORAL | 0 refills | Status: AC
Start: 2019-02-09 — End: 2019-02-16

## 2019-02-09 NOTE — Progress Notes (Signed)
Flagyl called into patients pharmacy on record.

## 2019-03-11 ENCOUNTER — Encounter: Payer: Self-pay | Admitting: Family Medicine

## 2019-07-06 ENCOUNTER — Emergency Department
Admission: EM | Admit: 2019-07-06 | Discharge: 2019-07-06 | Disposition: A | Discharge status: Home / Self Care | Payer: 59 | Attending: Emergency Medical Services | Admitting: Emergency Medical Services

## 2019-07-06 DIAGNOSIS — Z87891 Personal history of nicotine dependence: Secondary | ICD-10-CM | POA: Insufficient documentation

## 2019-07-06 DIAGNOSIS — X58XXXA Exposure to other specified factors, initial encounter: Secondary | ICD-10-CM | POA: Insufficient documentation

## 2019-07-06 DIAGNOSIS — S39012A Strain of muscle, fascia and tendon of lower back, initial encounter: Secondary | ICD-10-CM | POA: Insufficient documentation

## 2019-07-06 DIAGNOSIS — S29012A Strain of muscle and tendon of back wall of thorax, initial encounter: Secondary | ICD-10-CM | POA: Insufficient documentation

## 2019-07-06 LAB — URINALYSIS REFLEX TO MICROSCOPIC EXAM - REFLEX TO CULTURE
Bilirubin, UA: NEGATIVE
Glucose, UA: NEGATIVE
Ketones UA: NEGATIVE
Leukocyte Esterase, UA: NEGATIVE
Nitrite, UA: NEGATIVE
Protein, UR: 30 — AB
Specific Gravity UA: 1.026 (ref 1.001–1.035)
Urine pH: 5 (ref 5.0–8.0)
Urobilinogen, UA: 2 mg/dL (ref 0.2–2.0)

## 2019-07-06 LAB — URINE HCG QUALITATIVE: Urine HCG Qualitative: NEGATIVE

## 2019-07-06 MED ORDER — DIAZEPAM 5 MG PO TABS
5.0000 mg | ORAL_TABLET | Freq: Once | ORAL | Status: AC
Start: 2019-07-06 — End: 2019-07-06
  Administered 2019-07-06: 5 mg via ORAL
  Filled 2019-07-06: qty 1

## 2019-07-06 MED ORDER — IBUPROFEN 600 MG PO TABS
600.0000 mg | ORAL_TABLET | Freq: Four times a day (QID) | ORAL | 0 refills | Status: AC | PRN
Start: 2019-07-06 — End: ?

## 2019-07-06 MED ORDER — DIAZEPAM 5 MG PO TABS
5.0000 mg | ORAL_TABLET | Freq: Four times a day (QID) | ORAL | 0 refills | Status: AC | PRN
Start: 2019-07-06 — End: ?

## 2019-07-06 MED ORDER — KETOROLAC TROMETHAMINE 30 MG/ML IJ SOLN
60.0000 mg | Freq: Once | INTRAMUSCULAR | Status: AC
Start: 2019-07-06 — End: 2019-07-06
  Administered 2019-07-06: 60 mg via INTRAMUSCULAR
  Filled 2019-07-06: qty 1
  Filled 2019-07-06: qty 2

## 2019-07-06 NOTE — ED Triage Notes (Signed)
muscle spasm down left side of back x2 days, tylenol, heat and lido patches tried at home

## 2019-07-06 NOTE — ED Notes (Addendum)
Patient able to ambulate to bathroom unassisted with mild pain

## 2019-07-06 NOTE — Discharge Instructions (Signed)
Dear Amber Hughes,    You were seen today by Deborah Chalk, PA-C. Thank you for choosing the [facility] for your healthcare needs.  We hope your visit today was EXCELLENT.      Apply a heating pad as needed to the area of discomfort.  Take Ibuprofen 600mg  every 6 hours as needed for pain. Take with food.  Use valium (muscle relaxant) for spasms. Do not take this when working or driving as it can make you sleepy.   Follow up with Dr. Hermenia Bers or your doctor in 3-4 days for re-evaluation.      If you have any questions or concerns, I am available at [phone]. Please do not hesitate to contact me if I can be of assistance.     Below is some information and resources that our patients often find helpful.    Sincerely,    Deborah Chalk, Physician Assistant  [facility] - Department of Emergency Medicine    ________________________________________________________________      Thank you for choosing [facility] for your emergency care needs.  We strive to provide EXCELLENT care to you and your family.      DOCTOR REFERRALS  Call [phone] if you need any further referrals and we can help you find a primary care doctor or specialist.  Also, available online at:  [email or web address]    YOUR CONTACT INFORMATION  Before leaving please check with registration to make sure we have an up-to-date contact number.  You can call registration at [phone] to update your information.  For questions about your hospital bill, please call [phone].  For questions about your Emergency Dept Physician bill please call [phone].      FREE HEALTH SERVICES  If you need help with health or social services, please call 2-1-1 for a free referral to resources in your area.  2-1-1 is a free service connecting people with information on health insurance, free clinics, pregnancy, mental health, dental care, food assistance, housing, and substance  abuse counseling.  Also, available online at:  http://www.[facility].org    MEDICAL RECORDS AND TESTS  Certain laboratory test results do not come back the same day, for example urine cultures.  We will contact you if other important findings are noted.  Radiology films are often reviewed again to ensure accuracy.  If there is any discrepancy, we will notify you.      Please call [phone] to pick up a complimentary CD of any radiology studies performed.  If you or your doctor would like to request a copy of your medical records, please call [phone].      ORTHOPEDIC INJURY   Please know that significant injuries can exist even when an initial x-ray is read as normal or negative.  This can occur because some fractures (broken bones) are not initially visible on x-rays.  For this reason, close outpatient follow-up with your primary care doctor or bone specialist (orthopedist) is required.    MEDICATIONS AND FOLLOWUP  Please be aware that some prescription medications can cause drowsiness.  Use caution when driving or operating machinery.    The examination and treatment you have received in our Emergency Department is provided on an emergency basis, and is not intended to be a substitute for your primary care physician.  It is important that your doctor checks you again and that you report any new or remaining problems at that time.  LOCAL PHARMACIES  [facility] - [address] [place], [place] [address] (1.4 miles, 7 minutes)  [place], [place], [place] [address] (6.5 miles, 13 minutes)  Handout with directions available on request    PATIENT RELATIONS  If you have any concerns, issues, or feedback related to your care, positive or negative, please do not hesitate to contact Patient Relations at [phone]. They are open from 8:30AM-5:00PM Monday through Friday.

## 2019-07-06 NOTE — ED Provider Notes (Signed)
[facility]       [facility]  EMERGENCY DEPARTMENT HISTORY AND PHYSICAL EXAM    Patient Information     Patient Name: Amber Hughes, Amber Hughes  Encounter Date:  07/06/2019  Patient DOB:  Sep 22, 1990  MRN:  [id number]  Room:  H 1/H 1  Rendering Provider: Cyndy Freeze. Jeannetta Nap, PA-C, CAQ-EM    History of Presenting Illness     Chief Complaint: back pain  Historian: pt  Onset: gradual  Quality: "like a tight muscle"  Location: left mid and lower back   Duration: x2 days      HPI Comments:   29 y.o. female former smoker c/o gradual onset of left mid and lower back pain x2 days.  Patient states that the pain feels "like a tight muscle".  Symptoms began after doing some heavy lifting over the weekend.  She states that the pain is worse if she twists at her torso or tries to reach down and bend forward and is better when sitting perfectly still.  No fever, chest pain, shortness of breath, cough, abdominal pain, nausea, vomiting, dysuria, hematuria, vaginal bleeding or discharge, leg pain or swelling, paresthesias, extremity weakness, or bowel/bladder incontinence.  No radiation of pain into the legs. No recent travel or surgery.  No OCP use.  No fall or direct injury to the area.  No other complaints at this time.  Patient has tried using a back brace and taking Tylenol which she states does help a little bit with her symptoms.      PMD: Pcp, None, MD    Past Medical History     Past Medical History:   Diagnosis Date   . History of chlamydia        Past Surgical History     Past Surgical History:   Procedure Laterality Date   . CESAREAN SECTION      x2       Family History     Family History   Problem Relation Age of Onset   . Diabetes Father    . Hypertension Father    . Diabetes Mother    . Hypertension Mother    . Breast cancer Neg Hx    . Colon cancer Neg Hx    . Ovarian cancer Neg Hx        Social History     Social History     Socioeconomic History   . Marital status: Single     Spouse name:  Not on file   . Number of children: Not on file   . Years of education: Not on file   . Highest education level: Not on file   Occupational History   . Not on file   Social Needs   . Financial resource strain: Not on file   . Food insecurity     Worry: Not on file     Inability: Not on file   . Transportation needs     Medical: Not on file     Non-medical: Not on file   Tobacco Use   . Smoking status: Former Smoker     Packs/day: 0.25     Types: Cigarettes     Quit date: 2021     Years since quitting: 0.1   . Smokeless tobacco: Never Used   Substance and Sexual Activity   . Alcohol use: Yes     Frequency: Never     Comment: socially   . Drug use: Never   .  Sexual activity: Not Currently     Partners: Male     Birth control/protection: None   Lifestyle   . Physical activity     Days per week: Not on file     Minutes per session: Not on file   . Stress: Not on file   Relationships   . Social Wellsite geologist on phone: Not on file     Gets together: Not on file     Attends religious service: Not on file     Active member of club or organization: Not on file     Attends meetings of clubs or organizations: Not on file     Relationship status: Not on file   . Intimate partner violence     Fear of current or ex partner: Not on file     Emotionally abused: Not on file     Physically abused: Not on file     Forced sexual activity: Not on file   Other Topics Concern   . Not on file   Social History Narrative   . Not on file   Friend/family member at bedside     Allergies     No Known Allergies    Home Medications     Prior to Admission medications    Not on File         Review of Systems     Review of Systems   Constitutional: Negative for fever.   Cardiovascular: Negative for chest pain.   Respiratory: Negative for cough and shortness of breath.   Gastrointestinal: Negative for nausea, vomiting, and abd pain.  Genitourinary: No dysuria, hematuria, vaginal bleeding or discharge.   Musculoskeletal: Positive for back pain.  No leg pain.  Skin: Negative for rash.   Neuro: Negative for paresthesias, extremity weakness, or bowel/bladder incontinence.   All other systems reviewed and are negative.      Physical Exam     Patient Vitals for the past 24 hrs:   BP Temp Temp src Pulse Resp SpO2 Weight   07/06/19 2019 135/85 98.5 F (36.9 C) - 74 18 98 % -   07/06/19 1740 (!) 134/92 98.3 F (36.8 C) Oral 73 18 100 % -   07/06/19 1735 - - Oral - - - 89.2 kg     Physical Exam   Nursing note and vitals reviewed.   Constitutional: Pt is oriented to person, place, and time. Pt appears well-developed and well-nourished. Comfortable. No distress.   HENT:     Head: Normocephalic and atraumatic.     Right Ear: External ear normal.     Left Ear: External ear normal.     Mouth/Throat: Normal speech. (Wearing a mask).  Eyes: Conjunctivae normal. No drainage. EOMI.  Neck: Normal range of motion.   Cardiovascular: Regular rate and rhythm. No murmur.    Pulmonary/Chest: Vesicular breath sounds throughout. No respiratory distress. O2 sat 100% on RA.   Abdominal: Bowel sounds are normal. Abdomen soft and non-tender.  Pelvic/GU: n/a  MSK: BACK: No rash or lesions. +TTP at midline lumbar spine and in the left paraspinous muscles of the lumbar back. LOWER EXT: No LE edema. 2+ DP pulses BLE. Sensation equal and intact BLE.  Neurological: Pt is alert and oriented to person, place, and time. GCS 15.    Skin: Skin is warm and dry. Normal skin turgor.   Psychiatric: Normal mood and affect. Behavior is normal.  Orders Placed During This Encounter     Orders Placed This Encounter   Procedures   . UA Reflex to Micro - Reflex to Culture   . Urine HCG, Qualitative   . Ambulate Patient       ED Medications Administered     ED Medication Orders (From admission, onward)    Start Ordered     Status Ordering Provider    07/06/19 1813 07/06/19 1812  ketorolac (TORADOL) injection 60 mg  Once     Route: Intramuscular  Ordered Dose: 60 mg     Last MAR action: Given Deborah Chalk A    07/06/19 1813 07/06/19 1812  diazePAM (VALIUM) tablet 5 mg  Once     Route: Oral  Ordered Dose: 5 mg     Last MAR action: Given Anthonia Monger A          Diagnostic Study Results and Data Review     The results of the diagnostic studies below were reviewed by the ED provider:    Labs  Results     Procedure Component Value Units Date/Time    UA Reflex to Micro - Reflex to Culture [[id number]]  (Abnormal) Collected: 07/06/19 1917     Updated: 07/06/19 1946     Urine Type Urine, Clean Ca     Color, UA Yellow     Clarity, UA Hazy     Specific Gravity UA 1.026     Urine pH 5.0     Leukocyte Esterase, UA Negative     Nitrite, UA Negative     Protein, UR 30     Glucose, UA Negative     Ketones UA Negative     Urobilinogen, UA 2.0 mg/dL      Bilirubin, UA Negative     Blood, UA Moderate     RBC, UA 0 - 2 /hpf      WBC, UA 0 - 5 /hpf      Squamous Epithelial Cells, Urine 6 - 10 /hpf      Urine Mucus Present    Narrative:      Replace urinary catheter prior to obtaining the urine culture  if it has been in place for greater than or equal to 14  days:->N/A No Foley  Indications for U/A Reflex to Micro - Reflex to  Culture:->Suprapubic Pain/Tenderness or Dysuria    Urine HCG, Qualitative [[id number]] Collected: 07/06/19 1917    Specimen: Urine Updated: 07/06/19 1930     Urine HCG Qualitative Negative    Narrative:      Replace urinary catheter prior to obtaining the urine culture  if it has been in place for greater than or equal to 14  days:->N/A No Foley  Indications for U/A Reflex to Micro - Reflex to  Culture:->Suprapubic Pain/Tenderness or Dysuria          Radiologic Studies  Radiology Results (24 Hour)     ** No results found for the last 24 hours. **            Abnormal results/incidental findings discussed with pt and/or family: yes       MDM and Clinical Notes     Working Differential (not completely inclusive): muscle spasm, strain/sprain, fracture, herniated disc, radiculopathy, sciatica, cauda equina syndrome,  kidney stone, UTI, pyelo, torsion, PE, AAA, dissection, etc    Nursing records reviewed and agree: Yes    Clinical Notes: Pt w/ left mid and lower back pain after heavy lifting over the weekend.  Describes pain feeling like a "tight muscle" in her back. No fever, cough or signs concerning for infectious process. No CP, SOB or PE RF. No abdominal complaints. No weakness or numbness. Pain reproducible w/ certain movements or positional changes as well as with palpation. Likely muscular. XR or other imaging likely to be low yield. Can check UA and treat for pain. Pt in agreement.    UA shows blood (pt just finished menses), but no signs of infxn  HCG neg    Re-Eval: Re-eval after toradol and valium - Pt states she's feeling "much better". Back pain improved.    All results r/w pt. Workup overall reassuring. Does have some blood on UA, but just finished menses. Kidney stone less likely based on exam and clinical picture. Offered imaging (CT), but after talking w/ pt more, we've decided to hold off at this time since her pain is so improved. Recc continue NSAIDS and valium PRN. Heating pad. Encouraged close f/u with PMD and/or return should her pain worsen or not improve. She's in agreement. Questions answered.     Personal Protective Equipment:  I was wearing the following personal protective equipment at the time of patient evaluation and for all face-to-face interactions:    Regular surgical mask, face shield, surgical cap, and gloves.    Pandemic Caveat:  The patient was seen and evaluated during the time of COVID pandemic.  Significant limitations can be present in the evaluation and management of emergency department patients during pandemic conditions, including but not limited to lack of testing, rapidly changing IHS protocols, and/or limited follow-up resources.         Prescriptions       Discharge Medication List as of 07/06/2019  8:08 PM      START taking these medications    Details   diazePAM (VALIUM) 5 MG  tablet Take 1 tablet (5 mg total) by mouth every 6 (six) hours as needed (muscle spasm), Starting Tue 07/06/2019, E-Rx      ibuprofen (ADVIL) 600 MG tablet Take 1 tablet (600 mg total) by mouth every 6 (six) hours as needed for Pain, Starting Tue 07/06/2019, E-Rx             Diagnosis and Disposition     Clinical Impression:  1. Back strain, initial encounter      Final diagnoses:   Back strain, initial encounter       Disposition:  ED Disposition     ED Disposition Condition Date/Time Comment    Discharge  Tue Jul 06, 2019  8:06 PM Allen Norris discharge to home/self care.    Condition at disposition: Stable                Rendering Provider: Cyndy Freeze. Jeannetta Nap, PA-C, CAQ-EM    Attending's signature signifies review of the provider note and clinical impression.      This note was generated by the [facility] EMR system/Dragon speech recognition and may contain inherent errors or omissions not intended by the user. Grammatical errors, random word insertions, deletions, pronoun errors and incomplete sentences are occasional consequences of this technology due to software limitations. Not all errors are caught or corrected. If there are questions or concerns about the content of this note or information contained within the body of this dictation they should be addressed directly with the author for clarification.           Nigel Sloop, [place]  07/07/19 1610       Deno Etienne  Tandy Gaw, MD  07/07/19 1229

## 2019-07-06 NOTE — ED Notes (Signed)
DSW or Walking Ability Test    DSW Is the patient able to perform the following function independently without weakness or dizziness?   D (dangle): Ask patient to sit on the edge of the bed Yes   S (stand): Ask patient to stand up and observe for any fatigue or swaying Yes   W (walk): Ask patient to lift their feet completely off the floor 3-4 times Yes

## 2019-07-27 ENCOUNTER — Telehealth (INDEPENDENT_AMBULATORY_CARE_PROVIDER_SITE_OTHER): Payer: Self-pay | Admitting: Family

## 2019-07-27 MED ORDER — VALACYCLOVIR HCL 1 G PO TABS
1000.0000 mg | ORAL_TABLET | Freq: Two times a day (BID) | ORAL | 0 refills | Status: AC
Start: 2019-07-27 — End: 2019-08-03

## 2019-07-27 MED ORDER — VALACYCLOVIR HCL 500 MG PO TABS
500.0000 mg | ORAL_TABLET | Freq: Every day | ORAL | 3 refills | Status: AC
Start: 2019-07-27 — End: 2020-07-26

## 2019-07-27 NOTE — Telephone Encounter (Signed)
Pt called and advised that she has painful sores on her vagina for the past 7 -10 days and wanted a prescription for Valtrex.  Advised patient that I will send her a prescription for 1000 mg PO every 12 h for 7 days and then for suppression she will take 500 mg po every day.

## 2020-01-20 IMAGING — US US OB COMP LESS 14 WK
1 series · 15 of 28 positions shown · non-contrast
Comparison: None.

CLINICAL DATA: Spotting, cramping

EXAM:
OBSTETRIC <14 WK ULTRASOUND
TECHNIQUE: Transabdominal ultrasound was performed for evaluation of the
gestation as well as the maternal uterus and adnexal regions.

[Series 1: us ob comp less 14 wk · 15 of 37 slices shown]
[im 1/37]
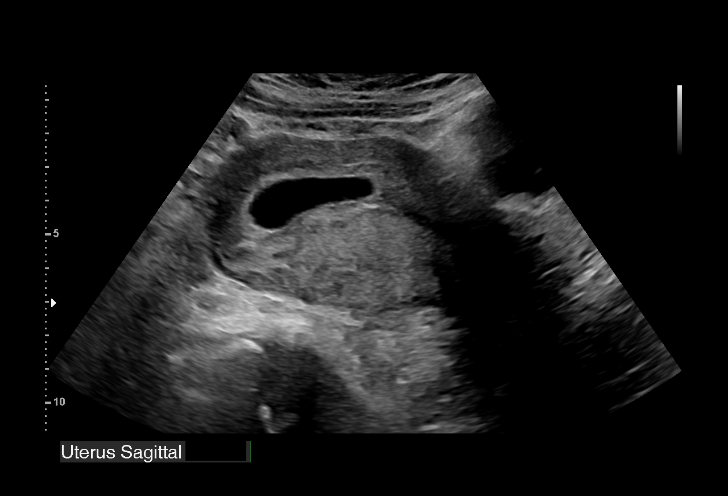
[im 3/37]
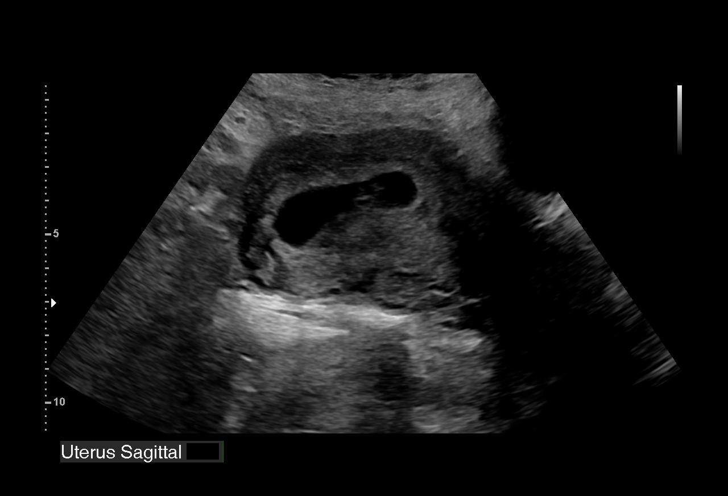
[im 6/37]
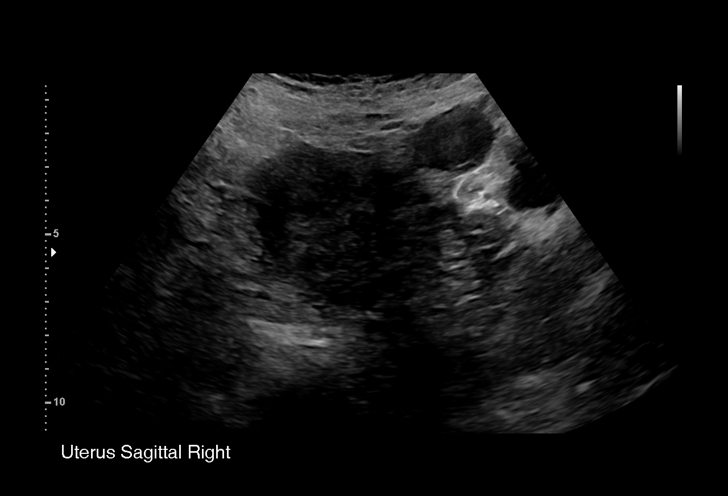
[im 9/37]
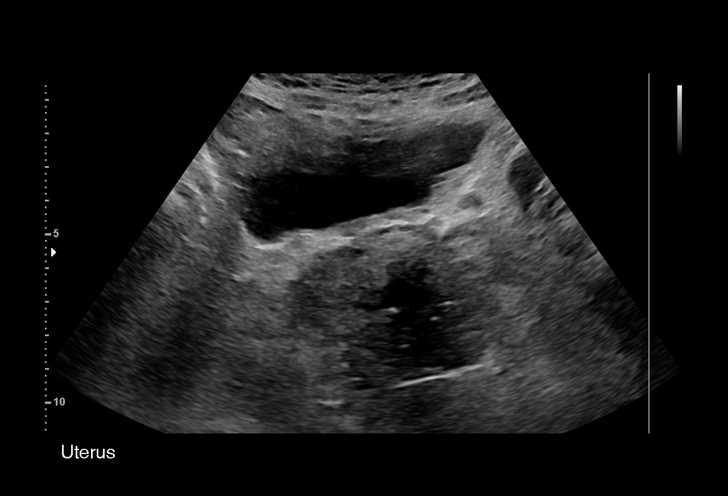
[im 11/37]
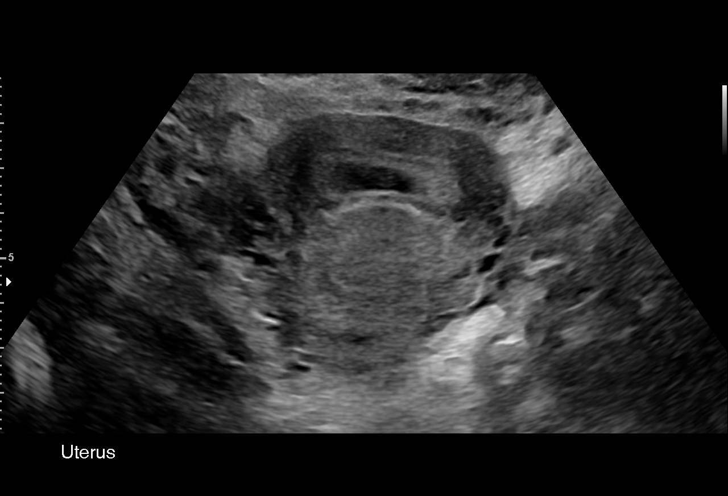
[im 14/37]
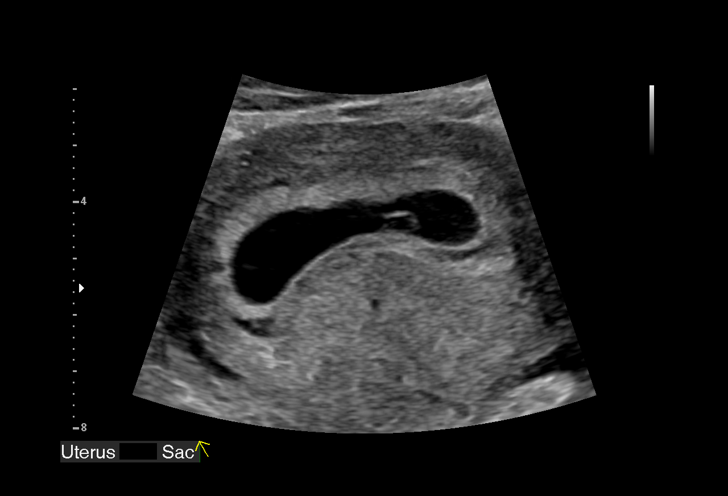
[im 17/37]
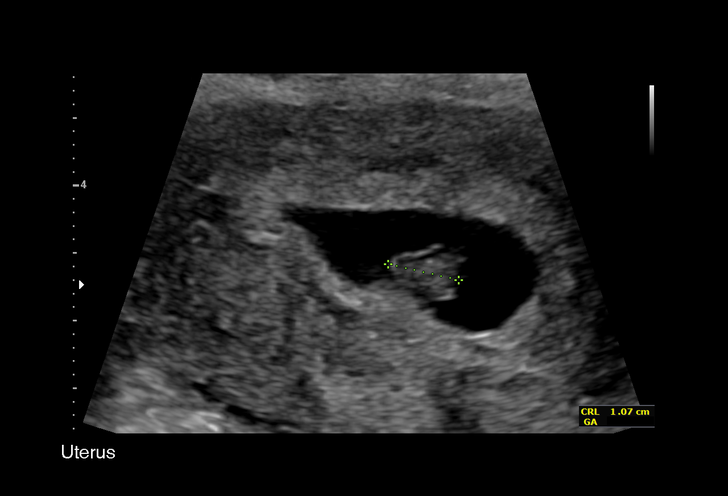
[im 19/37]
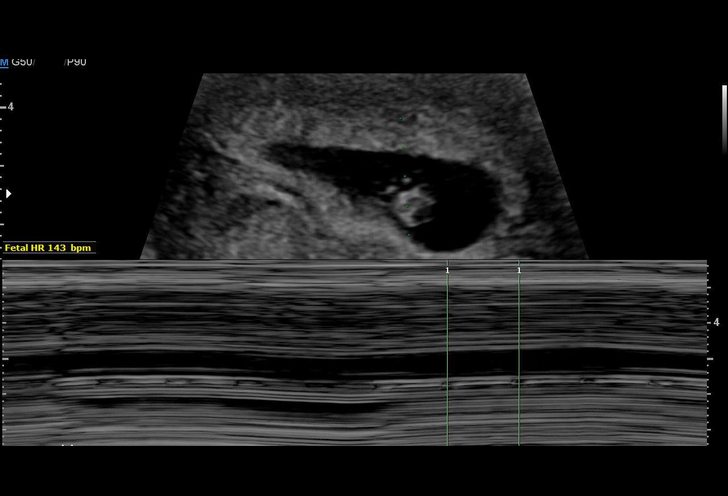
[im 21/37]
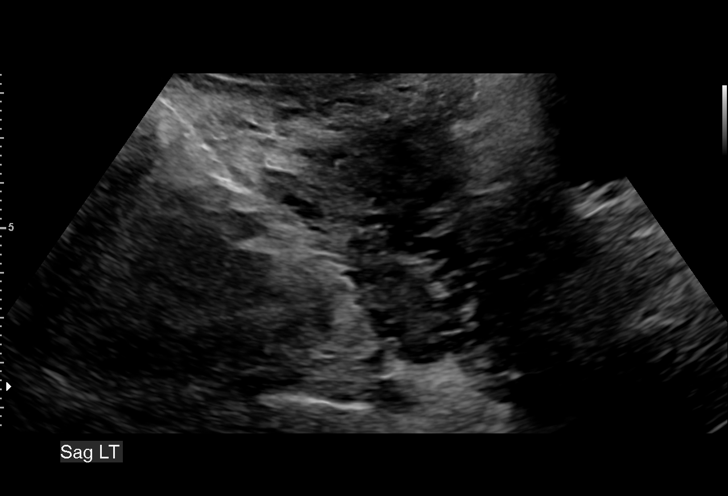
[im 23/37]
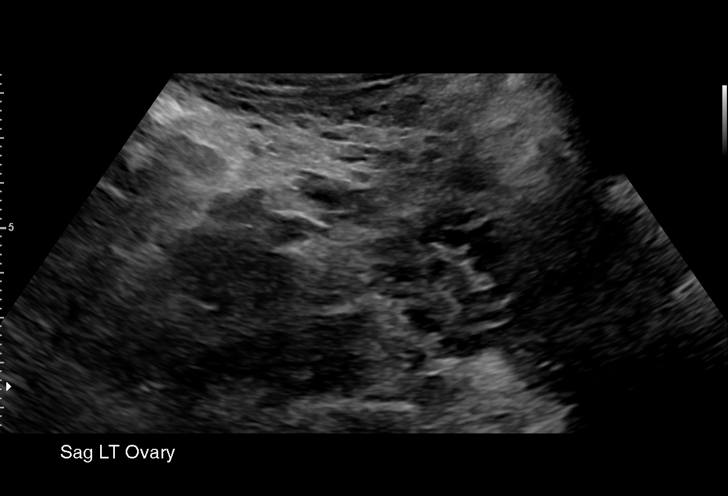
[im 26/37]
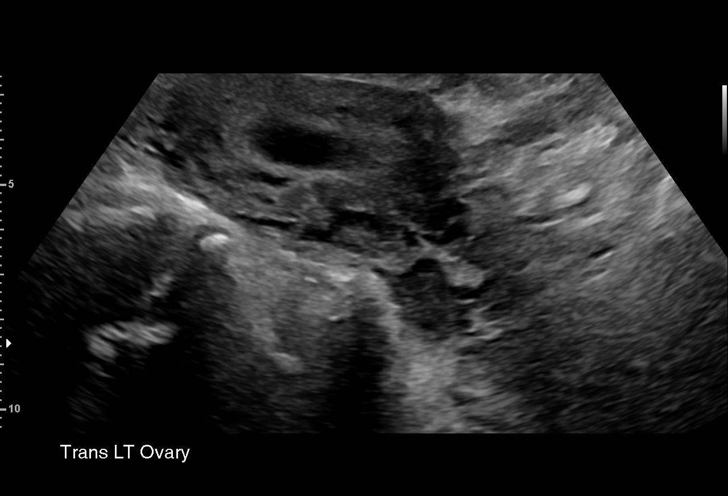
[im 29/37]
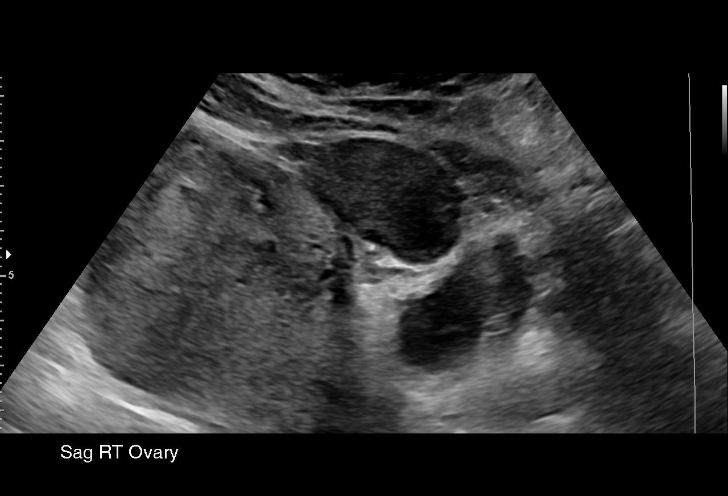
[im 31/37]
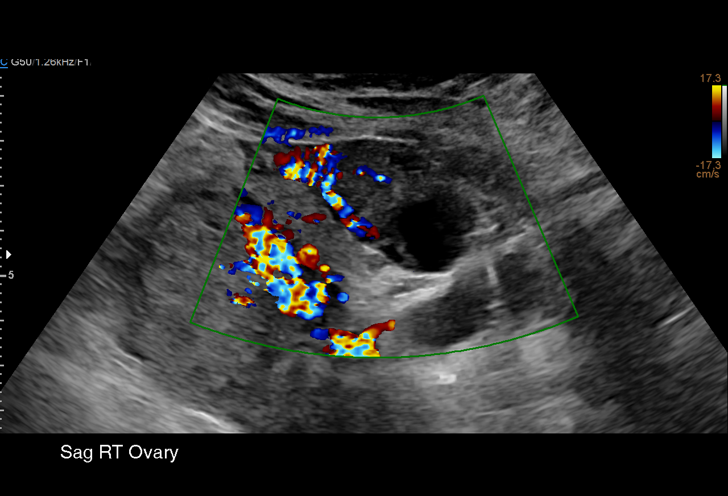
[im 34/37]
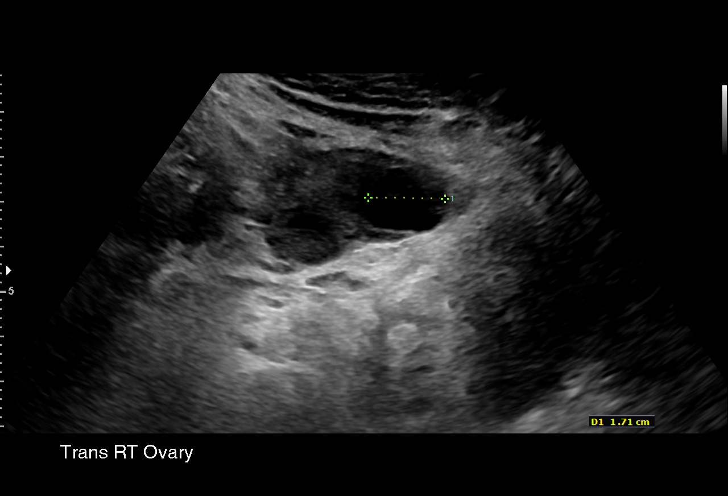
[im 37/37]
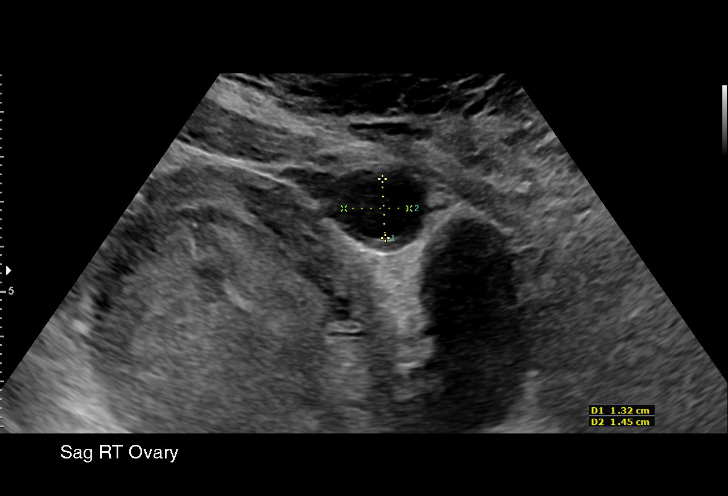

[15 of 28 positions shown; findings below may reference images not displayed]

FINDINGS: Intrauterine gestational sac: Single

Yolk sac:  Visualized

Embryo:  Visualized

Cardiac Activity: Visualized

Heart Rate: 150 bpm

MSD:   mm    w     d

CRL:   10.9 mm   7 w 1 d                  US EDC: 01/13/2018

Subchorionic hemorrhage:  None visualized.

Maternal uterus/adnexae: No adnexal masses or free fluid.
IMPRESSION: Seven week 1 day intrauterine pregnancy. Fetal heart rate 150 beats
per minute. No acute maternal findings.

## 2020-01-25 ENCOUNTER — Emergency Department: Payer: 59

## 2020-01-25 ENCOUNTER — Emergency Department
Admission: EM | Admit: 2020-01-25 | Discharge: 2020-01-26 | Disposition: A | Discharge status: Home / Self Care | Payer: 59 | Attending: Emergency Medical Services | Admitting: Emergency Medical Services

## 2020-01-25 DIAGNOSIS — Z331 Pregnant state, incidental: Secondary | ICD-10-CM | POA: Insufficient documentation

## 2020-01-25 DIAGNOSIS — R0602 Shortness of breath: Secondary | ICD-10-CM | POA: Insufficient documentation

## 2020-01-25 DIAGNOSIS — J069 Acute upper respiratory infection, unspecified: Secondary | ICD-10-CM | POA: Insufficient documentation

## 2020-01-25 DIAGNOSIS — R079 Chest pain, unspecified: Secondary | ICD-10-CM | POA: Insufficient documentation

## 2020-01-25 DIAGNOSIS — Z87891 Personal history of nicotine dependence: Secondary | ICD-10-CM | POA: Insufficient documentation

## 2020-01-25 LAB — COMPREHENSIVE METABOLIC PANEL
ALT: 8 U/L (ref 0–55)
AST (SGOT): 12 U/L (ref 5–34)
Albumin/Globulin Ratio: 1.5 (ref 0.9–2.2)
Albumin: 4.1 g/dL (ref 3.5–5.0)
Alkaline Phosphatase: 60 U/L (ref 37–106)
Anion Gap: 10 (ref 5.0–15.0)
BUN: 8 mg/dL (ref 7–19)
Bilirubin, Total: 0.2 mg/dL (ref 0.2–1.2)
CO2: 22 mEq/L (ref 22–29)
Calcium: 10.4 mg/dL (ref 8.5–10.5)
Chloride: 109 mEq/L (ref 100–111)
Creatinine: 0.8 mg/dL (ref 0.6–1.0)
Globulin: 2.8 g/dL (ref 2.0–3.6)
Glucose: 87 mg/dL (ref 70–100)
Potassium: 3.9 mEq/L (ref 3.5–5.1)
Protein, Total: 6.9 g/dL (ref 6.0–8.3)
Sodium: 141 mEq/L (ref 136–145)

## 2020-01-25 LAB — CBC AND DIFFERENTIAL
Absolute NRBC: 0 10*3/uL (ref 0.00–0.00)
Basophils Absolute Automated: 0.06 10*3/uL (ref 0.00–0.08)
Basophils Automated: 0.5 %
Eosinophils Absolute Automated: 0.23 10*3/uL (ref 0.00–0.44)
Eosinophils Automated: 1.7 %
Hematocrit: 33.2 % — ABNORMAL LOW (ref 34.7–43.7)
Hgb: 10.5 g/dL — ABNORMAL LOW (ref 11.4–14.8)
Immature Granulocytes Absolute: 0.08 10*3/uL — ABNORMAL HIGH (ref 0.00–0.07)
Immature Granulocytes: 0.6 %
Lymphocytes Absolute Automated: 2.56 10*3/uL (ref 0.42–3.22)
Lymphocytes Automated: 19.3 %
MCH: 24 pg — ABNORMAL LOW (ref 25.1–33.5)
MCHC: 31.6 g/dL (ref 31.5–35.8)
MCV: 75.8 fL — ABNORMAL LOW (ref 78.0–96.0)
MPV: 10.8 fL (ref 8.9–12.5)
Monocytes Absolute Automated: 0.78 10*3/uL (ref 0.21–0.85)
Monocytes: 5.9 %
Neutrophils Absolute: 9.55 10*3/uL — ABNORMAL HIGH (ref 1.10–6.33)
Neutrophils: 72 %
Nucleated RBC: 0 /100 WBC (ref 0.0–0.0)
Platelets: 316 10*3/uL (ref 142–346)
RBC: 4.38 10*6/uL (ref 3.90–5.10)
RDW: 17 % — ABNORMAL HIGH (ref 11–15)
WBC: 13.26 10*3/uL — ABNORMAL HIGH (ref 3.10–9.50)

## 2020-01-25 LAB — GFR: EGFR: >60

## 2020-01-25 LAB — HCG, SERUM, QUALITATIVE: Hcg Qualitative: POSITIVE — AB

## 2020-01-25 LAB — TROPONIN I: Troponin I: <0.01 ng/mL (ref 0.00–0.05)

## 2020-01-25 LAB — IHS D-DIMER: D-Dimer: 0.52 ug/mL FEU (ref 0.00–0.60)

## 2020-01-25 NOTE — ED Triage Notes (Signed)
Patient c/o chest tightness and pressure for the past 2 days. C/o shortness of breath. Denies fevers or cough

## 2020-01-25 NOTE — ED Provider Notes (Signed)
History     Chief Complaint   Patient presents with   . Chest Pain   . Shortness of Breath     29 year old female with reported cough, congestion, sore throat chest pain and shortness of breath.  Symptoms of been ongoing for the last 2 days.  Patient reports that she recently traveled to the [place].    The history is provided by the patient.   Chest Pain  Chest pain location: Diffuse chest.  Pain quality: dull    Pain radiates to:  Does not radiate  Pain severity:  Moderate  Onset quality:  Gradual  Timing:  Constant  Progression:  Unchanged  Chronicity:  New  Worsened by:  Nothing  Ineffective treatments:  None tried  Associated symptoms: shortness of breath    Associated symptoms: no abdominal pain, no back pain, no dizziness and no fever    Shortness of Breath  Associated symptoms: chest pain    Associated symptoms: no abdominal pain, no fever, no neck pain and no rash         Past Medical History:   Diagnosis Date   . History of chlamydia        Past Surgical History:   Procedure Laterality Date   . CESAREAN SECTION      x2       Family History   Problem Relation Age of Onset   . Diabetes Father    . Hypertension Father    . Diabetes Mother    . Hypertension Mother    . Breast cancer Neg Hx    . Colon cancer Neg Hx    . Ovarian cancer Neg Hx        Social  Social History     Tobacco Use   . Smoking status: Former Smoker     Packs/day: 0.25     Types: Cigarettes     Quit date: 2021     Years since quitting: 0.7   . Smokeless tobacco: Never Used   Vaping Use   . Vaping Use: Never used   Substance Use Topics   . Alcohol use: Yes     Comment: socially   . Drug use: Never       .     No Known Allergies    Home Medications             diazePAM (VALIUM) 5 MG tablet     Take 1 tablet (5 mg total) by mouth every 6 (six) hours as needed (muscle spasm)     ibuprofen (ADVIL) 600 MG tablet     Take 1 tablet (600 mg total) by mouth every 6 (six) hours as needed for Pain     valACYclovir HCL (VALTREX) 500 MG  tablet     Take 1 tablet (500 mg total) by mouth daily For suppression           Review of Systems   Constitutional: Negative.  Negative for fever.   HENT: Negative.    Eyes: Negative.    Respiratory: Positive for shortness of breath.    Cardiovascular: Positive for chest pain.   Gastrointestinal: Negative.  Negative for abdominal pain.   Endocrine: Negative.    Genitourinary: Negative.  Negative for dysuria.   Musculoskeletal: Negative.  Negative for back pain and neck pain.   Skin: Negative for rash.   Neurological: Negative.  Negative for dizziness.   Psychiatric/Behavioral: Negative.  Negative for suicidal ideas.   All other systems reviewed  and are negative.      Physical Exam    BP: 142/75, Heart Rate: 91, Temp: 98.8 F (37.1 C), Resp Rate: 18, SpO2: 99 %    Physical Exam  Vitals and nursing note reviewed.   Constitutional:       Appearance: She is well-developed.   HENT:      Head: Normocephalic.   Eyes:      Pupils: Pupils are equal, round, and reactive to light.   Cardiovascular:      Rate and Rhythm: Normal rate and regular rhythm.      Heart sounds: Normal heart sounds. No murmur heard.   No friction rub.   Pulmonary:      Effort: Pulmonary effort is normal. No respiratory distress.      Breath sounds: Normal breath sounds. No wheezing or rales.   Abdominal:      General: There is no distension.      Palpations: Abdomen is soft.      Tenderness: There is no abdominal tenderness.   Musculoskeletal:         General: Normal range of motion.      Cervical back: Normal range of motion and neck supple.   Skin:     General: Skin is warm and dry.      Findings: No rash.   Neurological:      Mental Status: She is alert and oriented to person, place, and time.   Psychiatric:         Behavior: Behavior normal.       EKG: Time of EKG 1920, normal sinus rhythm, rate 88, normal intervals and axis, no ST or T wave changes    The results of the diagnostic studies below were reviewed by the ED provider:    Labs  Results      Procedure Component Value Units Date/Time    COVID-19 (SARS-COV-2) ([place] Standard test)- Required by Extended care facility discharge with > 24 hours [[id number]] Collected: 01/25/20 2309    Specimen: Nasopharyngeal Swab from Nasopharynx Updated: 01/25/20 2309    Narrative:      o Collect and clearly label specimen type:  o PREFERRED-Upper respiratory specimen: One Nasopharyngeal  Swab in Transport Media.  o Hand deliver to laboratory ASAP  Testing as required by extended care facility?->Yes  Screening    Throat Culture [[id number]] Collected: 01/25/20 2309    Specimen: Throat Updated: 01/25/20 2309    D-Dimer [[id number]] Collected: 01/25/20 2117     Updated: 01/25/20 2155     D-Dimer 0.52 ug/mL FEU     Troponin I [[id number]] Collected: 01/25/20 2117    Specimen: Blood Updated: 01/25/20 2153     Troponin I <0.01 ng/mL     GFR [[id number]] Collected: 01/25/20 2117     Updated: 01/25/20 2149     EGFR >60.0    Comprehensive metabolic panel [[id number]] Collected: 01/25/20 2117    Specimen: Blood Updated: 01/25/20 2149     Glucose 87 mg/dL      BUN 8 mg/dL      Creatinine 0.8 mg/dL      Sodium 536 mEq/L      Potassium 3.9 mEq/L      Chloride 109 mEq/L      CO2 22 mEq/L      Calcium 10.4 mg/dL      Protein, Total 6.9 g/dL      Albumin 4.1 g/dL      AST (SGOT) 12 U/L      ALT 8  U/L      Alkaline Phosphatase 60 U/L      Bilirubin, Total 0.2 mg/dL      Globulin 2.8 g/dL      Albumin/Globulin Ratio 1.5     Anion Gap 10.0    Beta HCG, Qual, Serum [[id number]]  (Abnormal) Collected: 01/25/20 2117    Specimen: Blood Updated: 01/25/20 2140     Hcg Qualitative Positive    CBC and differential [[id number]]  (Abnormal) Collected: 01/25/20 2117    Specimen: Blood Updated: 01/25/20 2129     WBC 13.26 x10 3/uL      Hgb 10.5 g/dL      Hematocrit 08.6 %      Platelets 316 x10 3/uL      RBC 4.38 x10 6/uL      MCV 75.8 fL      MCH 24.0 pg      MCHC 31.6 g/dL      RDW 17 %      MPV 10.8 fL      Neutrophils 72.0 %      Lymphocytes Automated  19.3 %      Monocytes 5.9 %      Eosinophils Automated 1.7 %      Basophils Automated 0.5 %      Immature Granulocytes 0.6 %      Nucleated RBC 0.0 /100 WBC      Neutrophils Absolute 9.55 x10 3/uL      Lymphocytes Absolute Automated 2.56 x10 3/uL      Monocytes Absolute Automated 0.78 x10 3/uL      Eosinophils Absolute Automated 0.23 x10 3/uL      Basophils Absolute Automated 0.06 x10 3/uL      Immature Granulocytes Absolute 0.08 x10 3/uL      Absolute NRBC 0.00 x10 3/uL           Radiologic Studies  Radiology Results (24 Hour)     Procedure Component Value Units Date/Time    Chest AP Portable [[id number]] Collected: 01/26/20 0052    Order Status: Completed Updated: 01/26/20 0057    Narrative:      HISTORY: Chest pain. Sore throat.    COMPARISON: 09/23/2004    FINDINGS: AP portable view of the chest was performed. The  cardiomediastinal silhouette and pulmonary vascular pattern normal. The  lungs are clear. No pleural effusion or pneumothorax. No acute osseous  abnormality. Small round radiopaque density projects over the paramedian  right base of the neck, likely external to the patient.      Impression:           No evidence of acute cardiopulmonary process.    Demetrios Isaacs, MD   01/26/2020 12:55 AM            MDM and ED Course     ED Medication Orders (From admission, onward)    None             MDM  Number of Diagnoses or Management Options  Chest pain, unspecified type  Pregnancy, incidental  Upper respiratory tract infection, unspecified type  Diagnosis management comments: 29 year old female with cough chest pain upper respiratory symptoms.  Of note patient's pregnancy test is positive.  Last menstrual cycle was approximately 1 month ago.  Patient is likely 2 to [redacted] weeks pregnant.  She did not know.    Throat culture and Covid swab are pending, recommended that patient quarantine until the Covid swab comes back.       Amount and/or Complexity of  Data Reviewed  Clinical lab tests: ordered and reviewed  Tests in the  radiology section of CPT: reviewed    Patient Progress  Patient progress: stable                   Procedures    Clinical Impression & Disposition     Clinical Impression  Final diagnoses:   Pregnancy, incidental   Chest pain, unspecified type   Upper respiratory tract infection, unspecified type        ED Disposition     ED Disposition Condition Date/Time Comment    Discharge  Wed Jan 26, 2020  1:06 AM Allen Norris discharge to home/self care.    Condition at disposition: Stable           Discharge Medication List as of 01/26/2020  1:06 AM          This note was generated by the [employer] EMR system/ Dragon speech recognition and may contain inherent errors or omissions not intended by the user. Grammatical errors, random word insertions, deletions, pronoun errors and incomplete sentences are occasional consequences of this technology due to software limitations. Not all errors are caught or corrected. If there are questions or concerns about the content of this note or information contained within the body of this dictation they should be addressed directly withthe Thereasa Parkin for clarification           Hewitt Blade, [place]  01/26/20 0224       [place], Alexander-Nicholas D, MD  01/27/20 1359

## 2020-01-26 ENCOUNTER — Emergency Department: Payer: 59

## 2020-01-26 LAB — ECG 12-LEAD
Atrial Rate: 88 {beats}/min
P Axis: 50 degrees
P-R Interval: 138 ms
Q-T Interval: 354 ms
QRS Duration: 74 ms
QTC Calculation (Bezet): 428 ms
R Axis: 74 degrees
T Axis: 30 degrees
Ventricular Rate: 88 {beats}/min

## 2020-01-26 NOTE — Discharge Instructions (Signed)
Chest Pain of Unclear Etiology    You have been seen for chest pain. The cause of your pain is not yet known.    Your doctor has learned about your medical history, examined you, and checked any tests that were done. Still, it is not clear why you are having pain. The doctor thinks there is only a small chance that your pain is caused by a health problem that could lead to serious harm or death. Later, your primary care doctor might do more tests or check you again.    Sometimes chest pain is caused by a health problem that can lead to death, like a:   Heart attack.   Injury to the large blood vessel in your body (aorta).   Blood clot in the lung.   Collapsed lung.     It is not likely that your pain is caused by a health problem that could lead to death if:    Your chest pain lasts only a few seconds at a time   You are not short of breath, nauseated (sick to your stomach), sweaty, or lightheaded   Your pain gets worse when you twist or bend   Your pain improves with exercise or hard work.    Chest pain is serious. It is very important that you follow up with your regular doctor.    Return here or go to the nearest Emergency Department immediately if:   Your pain makes you short of breath, nauseated (sick to your stomach), or sweaty.   Your pain gets worse when you walk, go up stairs, or exert yourself.   You feel weak, lightheaded, or faint.   It hurts to breathe.   Your leg swells.   Your pain or symptoms get worse    You have new symptoms or concerns.    If you can't follow up with your doctor, or if at any time you feel you need to be rechecked or seen again, come back here or go to the nearest emergency department.               Pregnancy    It has been determined you are pregnant.    You originally came here for another problem. However, pregnancy will affect how we evaluate and treat your medical condition.    You have been referred to an obstetrician [facility] doctor). Call to make your  appointment today. Close follow-up with an OB doctor is very important for you and your baby.    Get follow-up care as soon as possible, certainly within the next few days. Inform your doctor or obstetrician of your visit here and your pregnancy.    If you smoke, stop. This is necessary to protect yourself and the health of your fetus (developing baby). Smoking makes miscarriage (losing your pregnancy) more likely.    Abusing alcohol or drugs will hurt the fetus (developing baby). It may cause a miscarriage, abnormal development or premature birth. It can also cause severe health or mental problems after birth. Avoid these substances from now until the end of your pregnancy.    If taking pain medicines, antipsychotics, antidepressants or seizure medicine, check with your doctor as soon as possible. Ask if you should continue with these medicines.    Return here or go to the nearest Emergency Department right away if you have: Pain or cramping in the abdomen (belly) or pelvis, vaginal bleeding, nausea/vomiting, dizziness/lightheadedness or passing out.    Keep yourself well-hydrated at all  times. Drink a lot of fluids and eat a balanced, healthy, nutritious diet.    Take prenatal vitamins every day. These may be prescribed for you on this visit. You may need to get them from the follow-up care provider. You can also get less expensive kinds over the counter (without a prescription). Ask you pharmacist for details.               Nasal Congestion    You have been seen for nasal congestion.    Nasal congestion is also called a "stuffy nose." Some people feel congested from having mucus in the nose. This can happen when people have colds, for example. However, true congestion is when membranes lining the nose get swollen and inflamed. Nasal congestion can have many causes. Some are colds, sinus infections, allergies and other irritants like pollution or dry air. Using nasal sprays or drops too much may also  cause congestion.    Congestion may cause ear problems like pain or pressure, trouble breathing or trouble sleeping. Mostly, a stuffy nose is just annoying!    Most nasal congestion is because of a virus and goes away with time. Some other ways to help symptoms are:   Elevate (lift) your head while sleeping. Congestion is often worse when lying down.   Keep nostrils moist with a saline (salt water) nasal spray.   Increase air humidity with a humidifier or vaporizer.   Drink plenty of fluids to stay well-hydrated.    Some other congestion treatments are decongestants and antihistamines. Never use decongestant sprays and drops for more than 3 days. They can actually make congestion worse! Many antihistamines may also make you sleepy. Never drive a car or operate machinery while taking these medicines.    YOU SHOULD SEEK MEDICAL ATTENTION IMMEDIATELY, EITHER HERE OR AT THE NEAREST EMERGENCY DEPARTMENT, IF ANY OF THE FOLLOWING OCCURS:   Symptoms get worse or do not get better after 2 weeks.   Swelling of the face or around the eyes develops   Blurry vision (seeing) or changes in vision.   Worsening pain of the face or severe headaches.   You have trouble breathing.   Fever (temperature higher than 100.33F / 38C).   Any other concerns.               Viral Syndrome    You have been diagnosed with a viral syndrome. This is also known as a "cold."    Some symptoms of a viral syndrome are fever (temperature higher than 100.33F / 38C) and headache. There may be stuffy nose, nasal congestion and sore throat. Cough and muscle aches are other symptoms. Sometimes there is a rash. Some people may also have nausea (sick to the stomach). Some have vomiting (throwing up) and/or diarrhea (loose stool).    Unfortunately, there is still no cure for the common cold! Antibiotics DO NOT work for viral illnesses. They may cause side-effects like rash or diarrhea (loose stool). Also, when antibiotics are used when not  needed, they may not work for future bacterial infections. Most patients get better with simple things like rest, fluids and a little time. Acetaminophen (Tylenol) and/or ibuprofen (Advil or Motrin) can also help.    NEVER take ASPIRIN products with a viral infection. Aspirin may cause liver failure with certain viral infections.    Routine follow-up with your primary care doctor is recommended.    YOU SHOULD SEEK MEDICAL ATTENTION IMMEDIATELY, EITHER HERE OR AT THE NEAREST EMERGENCY DEPARTMENT, IF  ANY OF THE FOLLOWING OCCURS:   Severe headache or stiff neck.   Uncontrolled vomiting.   Lightheadedness, feeling faint, or passing out.   Trouble breathing or shortness of breath.   Weakness or not able to walk.   Any other concerning symptoms or concerns.   You don't get better over 7 days or feel worse at any time.

## 2020-01-27 LAB — COVID-19 (SARS-COV-2): SARS CoV 2 Overall Result: NOT DETECTED

## 2020-05-05 ENCOUNTER — Ambulatory Visit (INDEPENDENT_AMBULATORY_CARE_PROVIDER_SITE_OTHER): Payer: 59

## 2020-05-05 DIAGNOSIS — Z1152 Encounter for screening for COVID-19: Secondary | ICD-10-CM

## 2020-05-05 DIAGNOSIS — Z20822 Contact with and (suspected) exposure to covid-19: Secondary | ICD-10-CM

## 2020-05-06 LAB — COVID-19 (SARS-COV-2): SARS CoV 2 Overall Result: DETECTED — AB

## 2020-05-08 ENCOUNTER — Telehealth (INDEPENDENT_AMBULATORY_CARE_PROVIDER_SITE_OTHER): Payer: Self-pay

## 2020-05-08 NOTE — Telephone Encounter (Signed)
Patient called to ask for COVID results. Relayed positive COVID results and advised patient of quarantine guidelines as advised by CDC.

## 2020-05-17 IMAGING — US US OB COMP LESS 14 WK
1 series · 15 of 28 positions shown · non-contrast
Comparison: None.

CLINICAL DATA: Vaginal bleeding in 1st trimester pregnancy. Pelvic
cramping. Gestational age by LMP of 7 weeks 3 days.

EXAM:
OBSTETRIC <14 WK ULTRASOUND
TECHNIQUE: Transabdominal ultrasound was performed for evaluation of the
gestation as well as the maternal uterus and adnexal regions.

[Series 1: us ob comp less 14 wk · 15 of 30 slices shown]
[im 1/30]
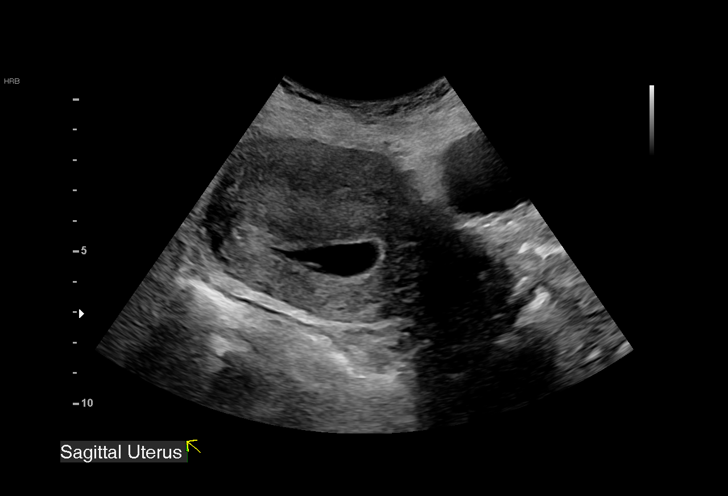
[im 3/30]
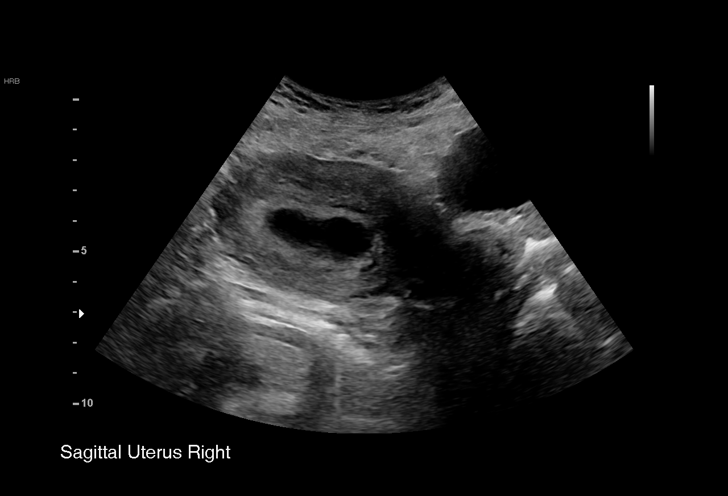
[im 5/30]
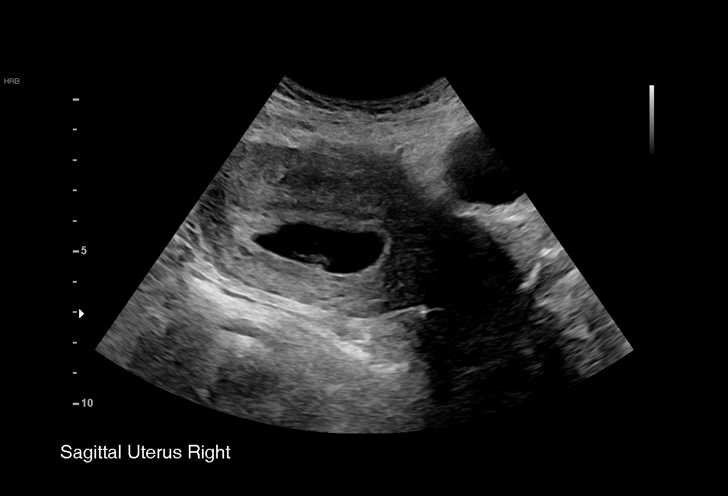
[im 7/30]
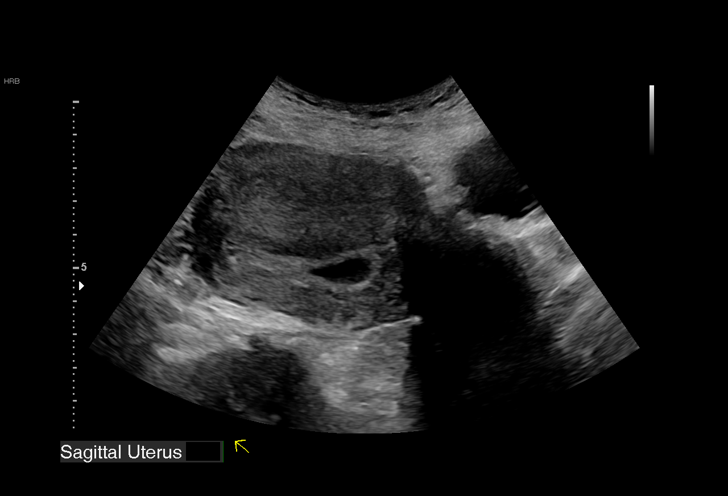
[im 9/30]
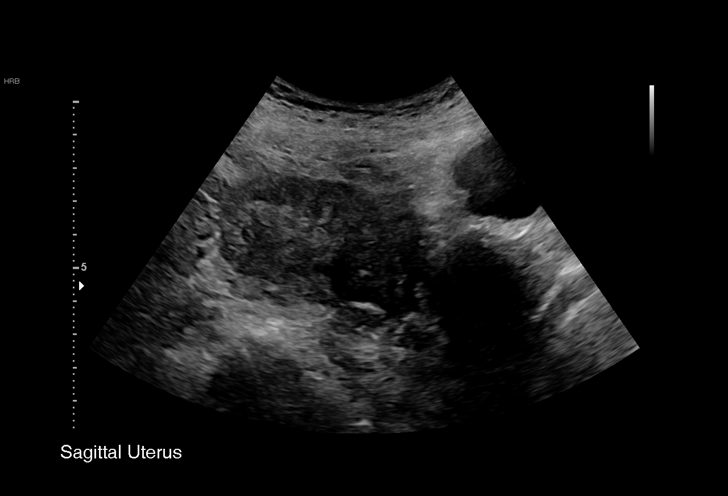
[im 11/30]
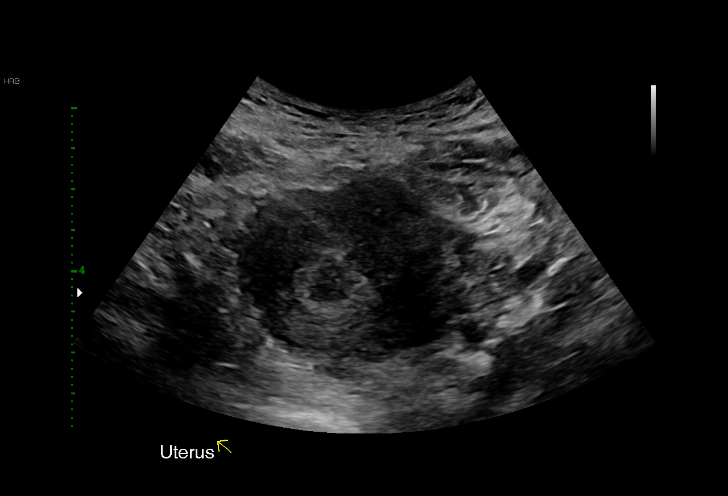
[im 13/30]
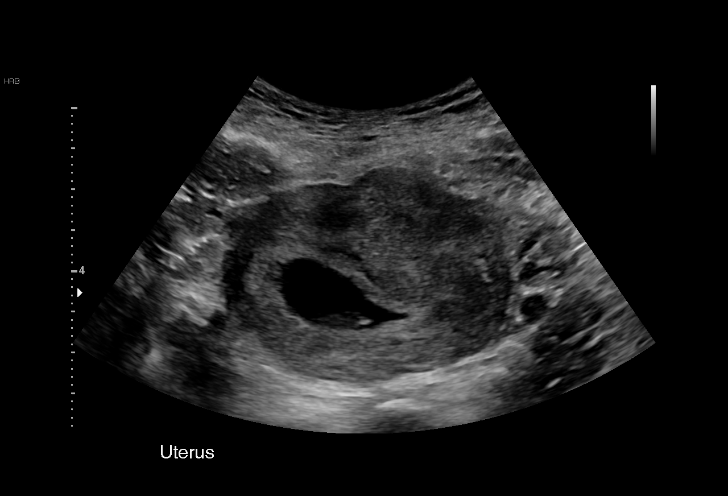
[im 16/30]
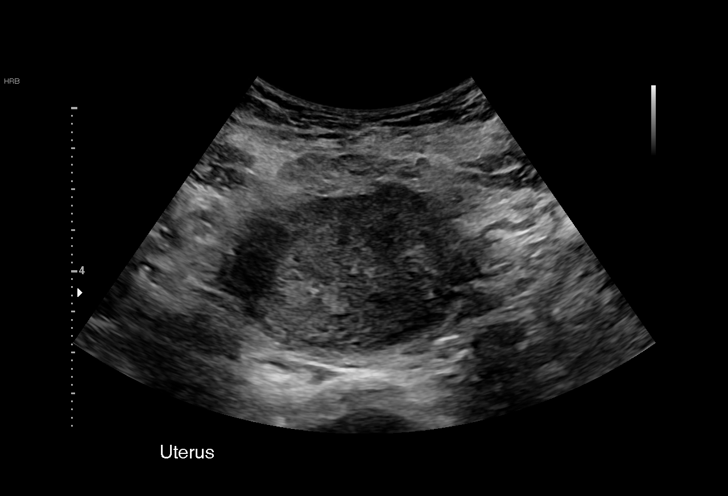
[im 17/30]
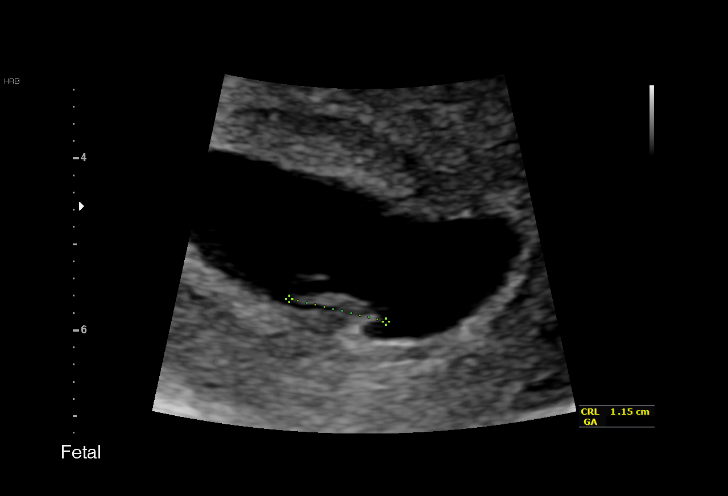
[im 19/30]
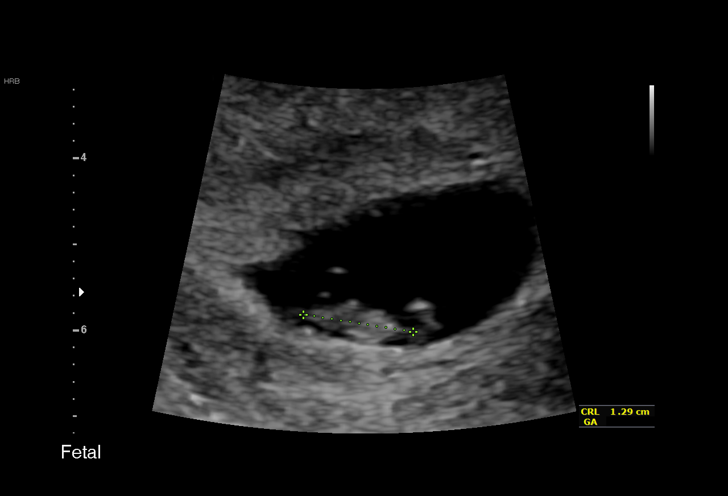
[im 21/30]
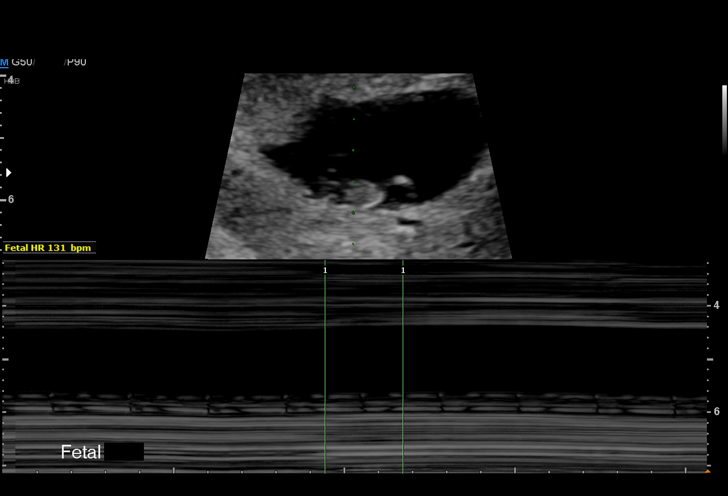
[im 23/30]
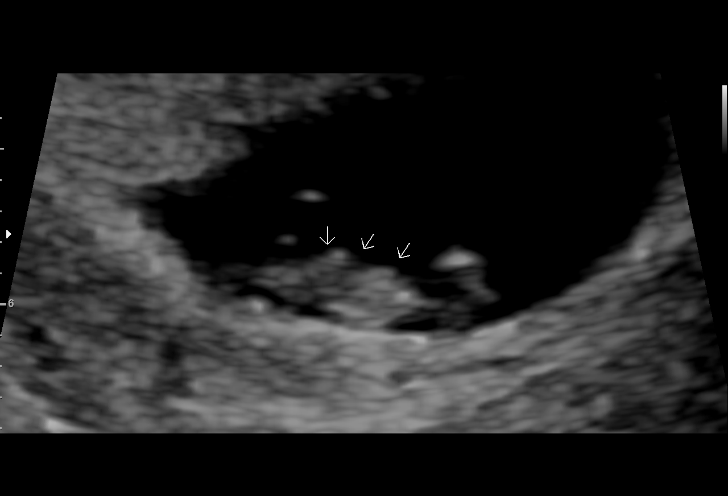
[im 25/30]
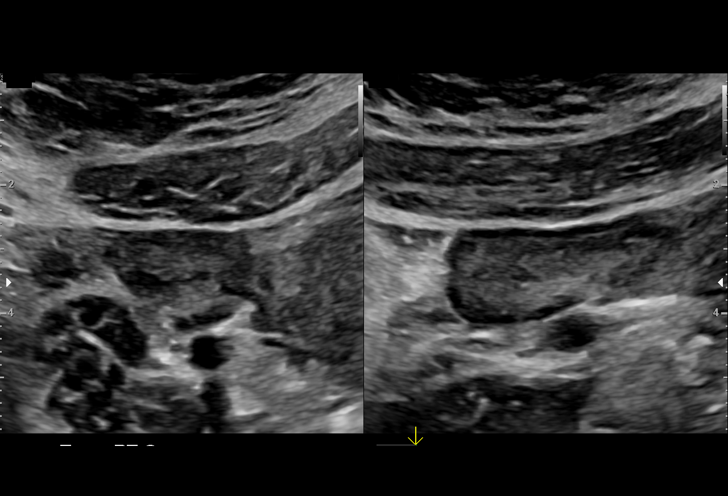
[im 27/30]
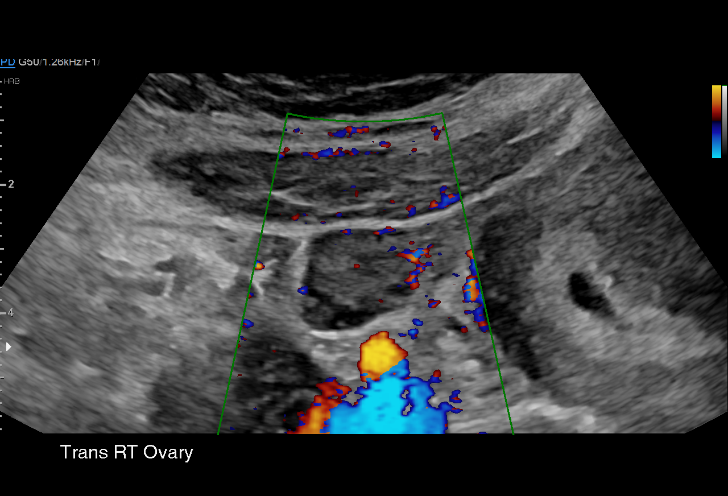
[im 30/30]
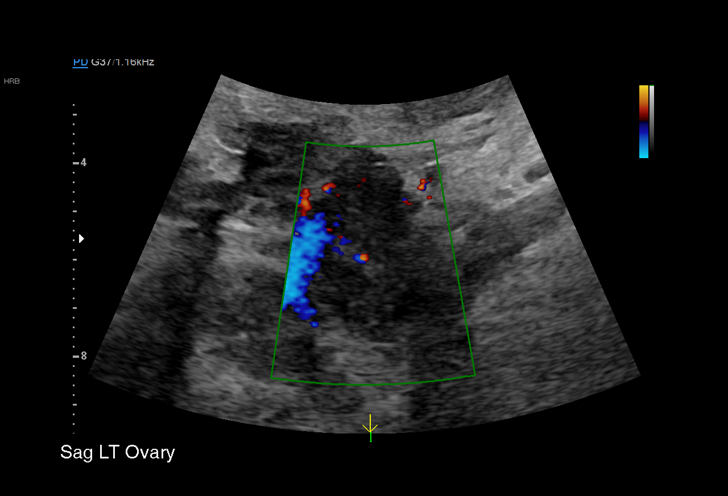

[15 of 28 positions shown; findings below may reference images not displayed]

FINDINGS: Intrauterine gestational sac: Single

Yolk sac:  Visualized.

Embryo:  Visualized.

Cardiac Activity: Visualized.

Heart Rate: 131 bpm

CRL:   12 mm   7 w 3 d                  US EDC: 05/09/2018

Subchorionic hemorrhage:  None visualized.

Maternal uterus/adnexae: Normal appearance of both ovaries. No mass
or abnormal free fluid identified.
IMPRESSION: Single living IUP measuring 7 weeks 3 days, with US EDC of
05/09/2018. This is concordant with LMP.

No significant maternal uterine or adnexal abnormality identified.

## 2021-08-10 ENCOUNTER — Encounter (INDEPENDENT_AMBULATORY_CARE_PROVIDER_SITE_OTHER): Payer: Self-pay | Admitting: Adult Health

## 2021-08-10 ENCOUNTER — Ambulatory Visit: Payer: Self-pay

## 2021-08-10 ENCOUNTER — Ambulatory Visit (INDEPENDENT_AMBULATORY_CARE_PROVIDER_SITE_OTHER): Payer: 59 | Admitting: Adult Health

## 2021-08-10 VITALS — BP 128/84 | HR 69 | Temp 97.9°F | Ht 60.0 in | Wt 188.4 lb

## 2021-08-10 DIAGNOSIS — Z1151 Encounter for screening for human papillomavirus (HPV): Secondary | ICD-10-CM

## 2021-08-10 DIAGNOSIS — Z124 Encounter for screening for malignant neoplasm of cervix: Secondary | ICD-10-CM

## 2021-08-10 DIAGNOSIS — Z01419 Encounter for gynecological examination (general) (routine) without abnormal findings: Secondary | ICD-10-CM

## 2021-08-10 DIAGNOSIS — R3915 Urgency of urination: Secondary | ICD-10-CM

## 2021-08-10 DIAGNOSIS — Z113 Encounter for screening for infections with a predominantly sexual mode of transmission: Secondary | ICD-10-CM

## 2021-08-10 LAB — POCT URINALYSIS DIPSTIX (10)(MULTI-TEST)
Bilirubin, UA POCT: NEGATIVE
Blood, UA POCT: NEGATIVE
Glucose, UA POCT: NEGATIVE mg/dL
Ketones, UA POCT: NEGATIVE mg/dL
Nitrite, UA POCT: NEGATIVE
POCT Leukocytes, UA: NEGATIVE
POCT Spec Gravity, UA: 1.03 (ref 1.001–1.035)
POCT pH, UA: 5.5 (ref 5–8)
Protein, UA POCT: NEGATIVE mg/dL
Urobilinogen, UA: 0.2 mg/dL

## 2021-08-10 LAB — THIN PREP-PAP W/IMAGING & HPV DNA (HIGH RISK W/16/18)

## 2021-08-10 NOTE — Progress Notes (Signed)
[name] is a 31 y.o. year old female, [phone] established patient, and she is here for a well woman exam.   Patient's last menstrual period was 07/19/2021 (approximate)..  Complaints/concerns:   Chief Complaint   Patient presents with    Gynecologic Exam     Annual Exam. Last PAP: 01/2019 WNL.       HPI:      Menstrual cycles are every 28 days   Sexually active Yes  Contraception condoms occasionally   Tried IUD, pills, depo, nexplanon in the past, does not want any methods now  Last Pap: 2020 normal   No h/o abnormal pap   Chlamydia- 5 yrs ago     Hx C/sx3    PCP- anemia but otherwise normal - starting iron supplement     PGM-leukemia and multiple myeloma  Maunt- breast in 20s  Maternal side w/ prostate, stomach lung     Urinary urgency- Started last week, seemed to come on all of the sudden. Started taking cranberry capsule which seemed to help.     OB History   Gravida Para Term Preterm AB Living   8 3 3   5 3    SAB IAB Ectopic Multiple Live Births   2 1     3       # Outcome Date GA Lbr Len/2nd Weight Sex Delivery Anes PTL Lv   8 AB 08/2017           7 AB 04/2017           6 SAB 04/2016           5 Term 10/11/08    M CSECT   LIV   4 Term 03/09/06    M CSECT   LIV   3 Term 08/13/04    M CSECT   LIV   2 SAB 04/2003           1 IAB              Past Medical History:   Diagnosis Date    History of chlamydia      Past Surgical History:   Procedure Laterality Date    CESAREAN SECTION      x2     Family History   Problem Relation Age of Onset    Diabetes Mother     Hypertension Mother     Diabetes Father     Hypertension Father     Breast cancer Maternal Aunt 25    Colon cancer Neg Hx     Ovarian cancer Neg Hx      Social History     Socioeconomic History    Marital status: Single   Tobacco Use    Smoking status: Former     Packs/day: 0.25     Types: Cigarettes     Quit date: 2021     Years since quitting: 2.2    Smokeless tobacco: Never   Vaping Use    Vaping status: Never Used   Substance and Sexual Activity     Alcohol use: Yes     Comment: socially    Drug use: Never    Sexual activity: Not Currently     Partners: Male     Birth control/protection: None     Social Determinants of Health     Financial Resource Strain: Medium Risk (08/07/2021)    Overall Financial Resource Strain (CARDIA)     Difficulty of Paying Living Expenses: Somewhat hard   Food  Insecurity: Unknown (08/07/2021)    Hunger Vital Sign     Worried About Running Out of Food in the Last Year: Never true   Transportation Needs: Unknown (08/07/2021)    PRAPARE - Therapist, art (Medical): No   Physical Activity: Insufficiently Active (08/07/2021)    Exercise Vital Sign     Days of Exercise per Week: 2 days     Minutes of Exercise per Session: 30 min   Stress: Stress Concern Present (08/07/2021)    [employer] of Occupational Health - Occupational Stress Questionnaire     Feeling of Stress : Very much   Social Connections: Moderately Isolated (08/07/2021)    Social Connection and Isolation Panel [NHANES]     Frequency of Communication with Friends and Family: More than three times a week     Attends Religious Services: 1 to 4 times per year     Active Member of [employer] or Organizations: No     Attends Banker Meetings: Never     Marital Status: Never married   Intimate Partner Violence: Not At Risk (08/07/2021)    Humiliation, Afraid, Rape, and Kick questionnaire     Fear of Current or Ex-Partner: No     Emotionally Abused: No     Physically Abused: No     Sexually Abused: No   Housing Stability: High Risk (08/07/2021)    Housing Stability Vital Sign     Unable to Pay for Housing in the Last Year: Yes     Current Outpatient Medications on File Prior to Visit   Medication Sig Dispense Refill    diazePAM (VALIUM) 5 MG tablet Take 1 tablet (5 mg total) by mouth every 6 (six) hours as needed (muscle spasm) 15 tablet 0    ibuprofen (ADVIL) 600 MG tablet Take 1 tablet (600 mg total) by mouth every 6 (six) hours as needed for Pain  30 tablet 0     No current facility-administered medications on file prior to visit.     No Known Allergies      Review of Systems     Constitutional: Negative for fever and chills.   Respiratory: Negative for shortness of breath.    Cardiovascular: Negative for chest pain.   Gastrointestinal: Negative for nausea, vomiting and abdominal pain.   Genitourinary: Negative for dysuria and pelvic pain. Urine frequency  All other systems reviewed and are negative.    OBJECTIVE:    Vitals: Blood pressure 128/84, pulse 69, temperature 97.9 F (36.6 C), temperature source Temporal, height 5' (1.524 m), weight 188 lb 6.4 oz (85.5 kg), last menstrual period 07/19/2021.Body mass index is 36.79 kg/m.      Exams:   GENERAL: well developed, well nourished, in no apparent distress  NECK: Neck is supple with full range of motion; thyroid is normal to palpation;   RESPIRATORY: normal respiratory rate and pattern with no distress;   BREASTS: symmetric; no overlying skin changes;  no tenderness, nodularity, palpable nodes,or masses;   GASTROINTESTINAL: no masses palpated; nontender;   Pelvic exam:  VULVA: normal appearing vulva with no masses, tenderness or lesions, normal clitoris  BLADDER: non tender bladder and urethra. No masses noted. Ureteral meatus appears normal without masses or tenderness  VAGINA: normal appearing vagina with normal color and discharge, no lesions, Normal vaginal canal. Prominent pelvic bone.   CERVIX: normal appearing cervix without discharge or lesions.   UTERUS: uterus is normal size, shape, consistency and nontender, anteverted  ADNEXA:  nontender and no masses.  MUSCULOSKELETAL: normal gait;   NEUROLOGIC: Alert and oriented   PSYCHIATRIC: appropriate affect and demeanor      ASSESSMENT:   1. Well woman exam with routine gynecological exam        2. Encounter for Papanicolaou smear for cervical cancer screening  Thin Prep PAP w/Imaging & HPV DNA(high risk w/16/18    Cervix ThinPrep Preservcyt- Chlamydia/  Neisseria by PCR      3. Encounter for screening for human papillomavirus (HPV)  Thin Prep PAP w/Imaging & HPV DNA(high risk w/16/18    Cervix ThinPrep Preservcyt- Chlamydia/ Neisseria by PCR      4. Screen for STD (sexually transmitted disease)  Thin Prep PAP w/Imaging & HPV DNA(high risk w/16/18    Cervix ThinPrep Preservcyt- Chlamydia/ Neisseria by PCR      5. Urgency of urination  POCT UA Dipstix (10)(Multi-Test)    Urine culture          Routine well woman gynecological examination:  Normal exam.     PLAN:       -Pap collected with HPV , if normal, next due in 3 years per ASCCP guidelines. Pap guidelines discussed with patient.   RECOMMENDATIONS given include: taught and demonstrated SBE. Reviewed nutrition and exercise.  -UA dip and urine culture for possible infection- continue with cranberry supplements, increase fluids. Discussed SUI vs infection. Follow pending results         There are no diagnoses linked to this encounter.  Orders Placed This Encounter   Procedures    Cervix ThinPrep Preservcyt- Chlamydia/ Neisseria by PCR     Order Specific Question:   Release to patient     Answer:   Immediate    Urine culture     Order Specific Question:   Release to patient     Answer:   Immediate    POCT UA Dipstix (10)(Multi-Test)     Order Specific Question:   Release to patient     Answer:   Immediate      Results through [facility].

## 2021-08-13 LAB — CERVIX THINPREP PRESERVCYT - CHLAMYDIA/NEISSERIA BY PCR
Chlamydia DNA by PCR: NEGATIVE
Neisseria gonorrhoeae by PCR: NEGATIVE

## 2021-08-13 LAB — THIN PREP-PAP W/IMAGING & HPV DNA (HIGH RISK W/16/18)
HPV Genotype, 16: NEGATIVE
HPV Genotype, 18: NEGATIVE
HPV High Risk, Other: NEGATIVE

## 2021-08-14 LAB — LAB USE ONLY - HISTORICAL GYN CYTOLOGY/PAP SMEAR

## 2021-08-22 ENCOUNTER — Ambulatory Visit (INDEPENDENT_AMBULATORY_CARE_PROVIDER_SITE_OTHER): Payer: 59 | Admitting: Geriatric Medicine

## 2021-09-29 ENCOUNTER — Emergency Department
Admission: EM | Admit: 2021-09-29 | Discharge: 2021-09-30 | Disposition: A | Discharge status: Home / Self Care | Payer: 59 | Attending: Emergency Medicine | Admitting: Emergency Medicine

## 2021-09-29 ENCOUNTER — Emergency Department: Payer: 59

## 2021-09-29 DIAGNOSIS — R079 Chest pain, unspecified: Secondary | ICD-10-CM

## 2021-09-29 DIAGNOSIS — Z87891 Personal history of nicotine dependence: Secondary | ICD-10-CM | POA: Insufficient documentation

## 2021-09-29 DIAGNOSIS — R0789 Other chest pain: Secondary | ICD-10-CM | POA: Insufficient documentation

## 2021-09-29 LAB — CBC AND DIFFERENTIAL
Absolute NRBC: 0 10*3/uL (ref 0.00–0.00)
Basophils Absolute Automated: 0.06 10*3/uL (ref 0.00–0.08)
Basophils Automated: 0.7 %
Eosinophils Absolute Automated: 0.15 10*3/uL (ref 0.00–0.44)
Eosinophils Automated: 1.8 %
Hematocrit: 32.9 % — ABNORMAL LOW (ref 34.7–43.7)
Hgb: 10.1 g/dL — ABNORMAL LOW (ref 11.4–14.8)
Immature Granulocytes Absolute: 0.01 10*3/uL (ref 0.00–0.07)
Immature Granulocytes: 0.1 %
Instrument Absolute Neutrophil Count: 4.25 10*3/uL (ref 1.10–6.33)
Lymphocytes Absolute Automated: 3.2 10*3/uL (ref 0.42–3.22)
Lymphocytes Automated: 38 %
MCH: 22.7 pg — ABNORMAL LOW (ref 25.1–33.5)
MCHC: 30.7 g/dL — ABNORMAL LOW (ref 31.5–35.8)
MCV: 74.1 fL — ABNORMAL LOW (ref 78.0–96.0)
MPV: 10.5 fL (ref 8.9–12.5)
Monocytes Absolute Automated: 0.76 10*3/uL (ref 0.21–0.85)
Monocytes: 9 %
Neutrophils Absolute: 4.25 10*3/uL (ref 1.10–6.33)
Neutrophils: 50.4 %
Nucleated RBC: 0 /100 WBC (ref 0.0–0.0)
Platelets: 305 10*3/uL (ref 142–346)
RBC: 4.44 10*6/uL (ref 3.90–5.10)
RDW: 16 % — ABNORMAL HIGH (ref 11–15)
WBC: 8.43 10*3/uL (ref 3.10–9.50)

## 2021-09-29 LAB — COMPREHENSIVE METABOLIC PANEL
ALT: 14 U/L (ref 0–55)
AST (SGOT): 13 U/L (ref 5–41)
Albumin/Globulin Ratio: 1.3 (ref 0.9–2.2)
Albumin: 3.8 g/dL (ref 3.5–5.0)
Alkaline Phosphatase: 68 U/L (ref 37–117)
Anion Gap: 7 (ref 5.0–15.0)
BUN: 9 mg/dL (ref 7.0–21.0)
Bilirubin, Total: 0.2 mg/dL (ref 0.2–1.2)
CO2: 24 mEq/L (ref 17–29)
Calcium: 9.6 mg/dL (ref 8.5–10.5)
Chloride: 110 mEq/L (ref 99–111)
Creatinine: 0.8 mg/dL (ref 0.4–1.0)
Globulin: 3 g/dL (ref 2.0–3.6)
Glucose: 109 mg/dL — ABNORMAL HIGH (ref 70–100)
Potassium: 3.9 mEq/L (ref 3.5–5.3)
Protein, Total: 6.8 g/dL (ref 6.0–8.3)
Sodium: 141 mEq/L (ref 135–145)
eGFR: >60 mL/min/{1.73_m2} (ref >=60)

## 2021-09-29 LAB — HIGH SENSITIVITY TROPONIN-I: hs Troponin-I: <2.7 ng/L

## 2021-09-29 LAB — IHS D-DIMER: D-Dimer: 0.51 ug/mL FEU (ref 0.00–0.60)

## 2021-09-29 LAB — HCG, SERUM, QUALITATIVE: Hcg Qualitative: NEGATIVE

## 2021-09-29 MED ORDER — CYCLOBENZAPRINE HCL 10 MG PO TABS
10.0000 mg | ORAL_TABLET | Freq: Two times a day (BID) | ORAL | 0 refills | Status: AC | PRN
Start: 2021-09-29 — End: 2021-10-14

## 2021-09-29 MED ORDER — LIDOCAINE 5 % EX PTCH
1.0000 | MEDICATED_PATCH | CUTANEOUS | 0 refills | Status: AC
Start: 2021-09-29 — End: ?

## 2021-09-29 MED ORDER — KETOROLAC TROMETHAMINE 30 MG/ML IJ SOLN
15.0000 mg | Freq: Once | INTRAMUSCULAR | Status: AC
Start: 2021-09-29 — End: 2021-09-29
  Administered 2021-09-29: 15 mg via INTRAVENOUS
  Filled 2021-09-29: qty 1

## 2021-09-29 MED ORDER — NAPROXEN 500 MG PO TABS
500.0000 mg | ORAL_TABLET | Freq: Two times a day (BID) | ORAL | 0 refills | Status: AC
Start: 2021-09-29 — End: 2021-10-13

## 2021-09-29 MED ORDER — LIDOCAINE 5 % EX PTCH
1.0000 | MEDICATED_PATCH | Freq: Once | CUTANEOUS | Status: DC
Start: 2021-09-29 — End: 2021-09-30
  Administered 2021-09-30: 1 via TRANSDERMAL
  Filled 2021-09-29: qty 1

## 2021-09-29 NOTE — ED Triage Notes (Signed)
Right sided tightness, feels like rib cage, hurts worse when taking deep breath, positional changes makes worse. Onset was yesterday, but worse today.

## 2021-09-29 NOTE — ED Provider Notes (Signed)
[facility] Note     Patient Name: Amber Hughes, Amber Hughes  Encounter Date:  09/29/2021  Attending Physician: Rulon Abide, MD  Patient DOB:  [birth year]  MRN:  [id number]  Room:  08/A08   None        Chief Complaint     Chief Complaint   Patient presents with    Chest Pain     Translator: No    History of Presenting Illness     31 y.o. female who presents with right sided chest pain. Onset was yesterday. Reports tightness on the right side. Worse with inspiration. Denies fevers or chills. No cough or productive sputum. Reports no recent trauma or falls. Does use a waist supporter at times.     No prior hx of PE/DVT. Denies prolonged trips. No recent surgeries.  No cardiac history.     Location of symptoms: right chest   Onset of symptoms: yesterday   Severity: moderate  Timing: constant   Exacerbating factors: n/a  Alleviating factors: n/a  Quality: tightness   Radiation of symptoms: none       PMD:  Pcp, None, MD    Nursing Notes Review  Nursing Notes were reviewed.     Previous Records Review  Previous records were reviewed to the extent practicable for the current presentation.      Past Medical History     Past Medical History:   Diagnosis Date    History of chlamydia        Medications       Current Facility-Administered Medications:     lidocaine (LIDODERM) 5 % 1 patch, 1 patch, Transdermal, Once in ED, Adjei-Twum, Kayson Bullis, MD    Current Outpatient Medications:     cyclobenzaprine (FLEXERIL) 10 MG tablet, Take 1 tablet (10 mg) by mouth 2 (two) times daily as needed for Muscle spasms, Disp: 10 tablet, Rfl: 0    diazePAM (VALIUM) 5 MG tablet, Take 1 tablet (5 mg total) by mouth every 6 (six) hours as needed (muscle spasm), Disp: 15 tablet, Rfl: 0    ibuprofen (ADVIL) 600 MG tablet, Take 1 tablet (600 mg total) by mouth every 6 (six) hours as needed for Pain, Disp: 30 tablet, Rfl: 0    lidocaine (LIDODERM) 5 %, Place 1 patch onto the skin every 24 hours Remove & Discard patch within 12 hours or as  directed by MD, Disp: 15 patch, Rfl: 0    naproxen (NAPROSYN) 500 MG tablet, Take 1 tablet (500 mg) by mouth 2 (two) times daily with meals for 14 days, Disp: 28 tablet, Rfl: 0    Allergies     No Known Allergies    Medical history, medications, and allergies reviewed.     Past Surgical History     Past Surgical History:   Procedure Laterality Date    CESAREAN SECTION      x2       Family History   The family history is not significantly contributory to current presentation.   Family History   Problem Relation Age of Onset    Diabetes Mother     Hypertension Mother     Diabetes Father     Hypertension Father     Breast cancer Maternal Aunt 25    Colon cancer Neg Hx     Ovarian cancer Neg Hx        Social History   Social history is not significantly contributory to the patient's current presentation.   Social History  Occupational History    Not on file   Tobacco Use    Smoking status: Former     Packs/day: 0.25     Types: Cigarettes     Quit date: 2021     Years since quitting: 2.4    Smokeless tobacco: Never   Vaping Use    Vaping status: Never Used   Substance and Sexual Activity    Alcohol use: Yes     Comment: socially    Drug use: Never    Sexual activity: Not Currently     Partners: Male     Birth control/protection: None       Review of Systems     Review of Systems   Constitutional:  Negative for chills and fever.   HENT: Negative.  Negative for hearing loss.    Respiratory:  Negative for cough and shortness of breath.    Cardiovascular:  Positive for chest pain.   Gastrointestinal:  Negative for abdominal pain, nausea and vomiting.   Genitourinary:  Negative for dysuria and urgency.   Skin: Negative.    Neurological: Negative.      All other systems reviewed: negative.       Physical Exam     Vital Signs  BP 124/69   Pulse 71   Temp 98.1 F (36.7 C) (Oral)   Resp 18   Wt 88 kg   SpO2 100%   BMI 37.89 kg/m     Review of Vital Signs  The patient's vital signs and oxygen saturation were reviewed  and interpreted by me, Mikey College Adjei-Twum MD.    Physical Exam    Constitutional: Well appearing, appears hydrated, in no acute distress   Eyes: No conjunctival discharge. EOMI.   Head, Ears, Nose, Mouth, Throat: Normocephalic. Moist mucous membranes.   Neck: Full range of motion. No obvious neck deformities. No tracheal deviation.   Cardiovascular: No obvious gallops. Normal capillary refill. Strong peripheral pulses.  Respiratory/Chest: No obvious chest wall asymmetry. Mild chest wall tenderness to palpation, no bony stepoffs noted. No resp distress. Clear lungs bilaterally   Gastrointestinal/Abdominal:  Soft abdomen. No abdominal distention.   Musculoskeletal: Normal ROM. No focal extremity tenderness.   Back: No deformity. No significant scoliosis.   Neurological: Alert. No acute focal deficits.   Skin: Warm skin. No acute rash. Not mottled.       Medical Decision Making     31 y.o. female who presents with right sided chest pain    Appears to be MSK related-patient with noted tenderness to palpation  Atypical for ACS- ekg with no acute ST changes. Labs, chest x-ray pending     Patient with no risk factors for PE.  D-dimer test also added      ED Course     ED Course as of 09/29/21 2343   Sat Sep 29, 2021   2341 Troponin and D-dimer within normal limits.  Stable hemoglobin likely related to iron deficiency anemia    Patient reports improvement in symptoms.  Will discharge home with Rx for naproxen plus Flexeril.  Lidoderm patches as needed.  Outpatient follow-up with PCP [KA]      ED Course User Index  [KA] Adjei-Twum, Randalyn Ahmed, MD         Procedures    EKG Interpretation: sinus rhythm at rate of 68, normal axis, no acute ST changes noted, qtc of 412   Radiology Interpretation: Chest: No acute infiltrate, no pneumothorax, or pleural effusion  ED Medications Administered     ED Medication Orders (From admission, onward)      Start Ordered     Status Ordering Provider    09/29/21 2341 09/29/21 2340  lidocaine  (LIDODERM) 5 % 1 patch  Once in ED        Route: Transdermal  Ordered Dose: 1 patch       Ordered ADJEI-TWUM, Lemmie Steinhaus    09/29/21 2224 09/29/21 2223  ketorolac (TORADOL) injection 15 mg  Once        Route: Intravenous  Ordered Dose: 15 mg       Last MAR action: Given ADJEI-TWUM, Ramey [place]            Orders Placed During This Encounter     Orders Placed This Encounter   Procedures    Chest AP Portable    CBC and differential    Comprehensive metabolic panel    High Sensitivity Troponin-I    Beta HCG, Qual, Serum    D-Dimer    Vital Signs    ECG 12 Lead    Saline lock IV       Diagnostic Study Results     The results of the diagnostic studies below were reviewed by the ED provider:    Labs  Results       Procedure Component Value Units Date/Time    High Sensitivity Troponin-I [[id number]] Collected: 09/29/21 2212    Specimen: Blood Updated: 09/29/21 2329     hs Troponin-I <2.7 ng/L     D-Dimer [[id number]] Collected: 09/29/21 2212     Updated: 09/29/21 2303     D-Dimer 0.51 ug/mL FEU     Comprehensive metabolic panel [[id number]]  (Abnormal) Collected: 09/29/21 2212    Specimen: Blood Updated: 09/29/21 2236     Glucose 109 mg/dL      BUN 9.0 mg/dL      Creatinine 0.8 mg/dL      Sodium 725 mEq/L      Potassium 3.9 mEq/L      Chloride 110 mEq/L      CO2 24 mEq/L      Calcium 9.6 mg/dL      Protein, Total 6.8 g/dL      Albumin 3.8 g/dL      AST (SGOT) 13 U/L      ALT 14 U/L      Alkaline Phosphatase 68 U/L      Bilirubin, Total 0.2 mg/dL      Globulin 3.0 g/dL      Albumin/Globulin Ratio 1.3     Anion Gap 7.0     eGFR >60.0 mL/min/1.73 m2     Beta HCG, Qual, Serum [[id number]] Collected: 09/29/21 2212    Specimen: Blood Updated: 09/29/21 2225     Hcg Qualitative Negative    CBC and differential [[id number]]  (Abnormal) Collected: 09/29/21 2212    Specimen: Blood Updated: 09/29/21 2221     WBC 8.43 x10 3/uL      Hgb 10.1 g/dL      Hematocrit 56.4 %      Platelets 305 x10 3/uL      RBC 4.44 x10 6/uL      MCV 74.1 fL      MCH 22.7  pg      MCHC 30.7 g/dL      RDW 16 %      MPV 10.5 fL      Instrument Absolute Neutrophil Count 4.25 x10 3/uL      Neutrophils 50.4 %  Lymphocytes Automated 38.0 %      Monocytes 9.0 %      Eosinophils Automated 1.8 %      Basophils Automated 0.7 %      Immature Granulocytes 0.1 %      Nucleated RBC 0.0 /100 WBC      Neutrophils Absolute 4.25 x10 3/uL      Lymphocytes Absolute Automated 3.20 x10 3/uL      Monocytes Absolute Automated 0.76 x10 3/uL      Eosinophils Absolute Automated 0.15 x10 3/uL      Basophils Absolute Automated 0.06 x10 3/uL      Immature Granulocytes Absolute 0.01 x10 3/uL      Absolute NRBC 0.00 x10 3/uL             Radiologic Studies  Radiology Results (24 Hour)       Procedure Component Value Units Date/Time    Chest AP Portable [[id number]] Collected: 09/29/21 2225    Order Status: Completed Updated: 09/29/21 2227    Narrative:      HISTORY: Chest Pain     COMPARISON: 01/26/2020    FINDINGS:     Portable AP view of the chest shows clear lungs.  Cardiomediastinal silhouette is not enlarged.  No focal bony lesion is seen.      Impression:          1. No acute cardiopulmonary process    J. Carole Binning, MD  09/29/2021 10:25 PM            Diagnosis and Disposition     Clinical Impression  1. Right-sided chest pain        Disposition  ED Disposition       ED Disposition   Discharge    Condition   --    Date/Time   Sat Sep 29, 2021 11:41 PM    Comment   Allen Norris discharge to home/self care.    Condition at disposition: Stable                   Prescriptions       New Prescriptions    CYCLOBENZAPRINE (FLEXERIL) 10 MG TABLET    Take 1 tablet (10 mg) by mouth 2 (two) times daily as needed for Muscle spasms    LIDOCAINE (LIDODERM) 5 %    Place 1 patch onto the skin every 24 hours Remove & Discard patch within 12 hours or as directed by MD    NAPROXEN (NAPROSYN) 500 MG TABLET    Take 1 tablet (500 mg) by mouth 2 (two) times daily with meals for 14 days          Rulon Abide, MD  09/29/21  2343

## 2021-09-30 MED ORDER — CYCLOBENZAPRINE HCL 10 MG PO TABS
10.0000 mg | ORAL_TABLET | Freq: Once | ORAL | Status: AC
Start: 2021-09-30 — End: 2021-09-30
  Administered 2021-09-30: 10 mg via ORAL
  Filled 2021-09-30: qty 1

## 2021-10-01 LAB — ECG 12-LEAD
Atrial Rate: 68 {beats}/min
IHS MUSE NARRATIVE AND IMPRESSION: NORMAL
P Axis: 55 degrees
P-R Interval: 146 ms
Q-T Interval: 388 ms
QRS Duration: 70 ms
QTC Calculation (Bezet): 412 ms
R Axis: 79 degrees
T Axis: 46 degrees
Ventricular Rate: 68 {beats}/min

## 2021-10-02 ENCOUNTER — Encounter: Payer: Self-pay | Admitting: *Deleted

## 2022-02-26 ENCOUNTER — Ambulatory Visit (INDEPENDENT_AMBULATORY_CARE_PROVIDER_SITE_OTHER): Payer: 59 | Admitting: Student in an Organized Health Care Education/Training Program

## 2022-03-05 ENCOUNTER — Ambulatory Visit (INDEPENDENT_AMBULATORY_CARE_PROVIDER_SITE_OTHER): Payer: 59 | Admitting: Student in an Organized Health Care Education/Training Program

## 2022-03-05 ENCOUNTER — Encounter (INDEPENDENT_AMBULATORY_CARE_PROVIDER_SITE_OTHER): Payer: Self-pay | Admitting: Student in an Organized Health Care Education/Training Program

## 2022-03-05 VITALS — BP 138/84 | HR 93 | Temp 98.2°F | Wt 178.4 lb

## 2022-03-05 DIAGNOSIS — N898 Other specified noninflammatory disorders of vagina: Secondary | ICD-10-CM

## 2022-03-05 DIAGNOSIS — N390 Urinary tract infection, site not specified: Secondary | ICD-10-CM

## 2022-03-05 LAB — POCT URINALYSIS DIPSTIX (10)(MULTI-TEST)
Bilirubin, UA POCT: NEGATIVE
Blood, UA POCT: NEGATIVE
Glucose, UA POCT: NEGATIVE mg/dL
Ketones, UA POCT: NEGATIVE mg/dL
Nitrite, UA POCT: NEGATIVE
POCT Leukocytes, UA: NEGATIVE
POCT Spec Gravity, UA: 1.03 (ref 1.001–1.035)
POCT pH, UA: 5.5 (ref 5–8)
Urobilinogen, UA: 0.2 mg/dL

## 2022-03-05 NOTE — Progress Notes (Signed)
Subjective:       Patient ID: Amber Hughes is a 31 y.o. female.    Pt initially made appt when she had UTI sxs (was having dysuria, urinary frequency and urgent) in Oct, but due to issues with scheduling was seen at Urgent Care. She was given Macrobid, which she had finished and states her sxs have resolved. Wanted to see today if everything had resolved from UTI standpoint. Additionally wanted to know if she can have her estrogen level checked, as she has noticed vaginal dryness during intercourse.        The following portions of the patient's history were reviewed and updated as appropriate: allergies, current medications, past family history, past medical history, past social history, past surgical history, and problem list.    Review of Systems   Constitutional: Negative.    HENT: Negative.     Respiratory: Negative.     Cardiovascular: Negative.    Gastrointestinal: Negative.    Genitourinary: Negative.    Musculoskeletal: Negative.    Skin: Negative.    Psychiatric/Behavioral: Negative.     All other systems reviewed and are negative.          Objective:    Physical Exam  Vitals and nursing note reviewed.   Constitutional:       Appearance: Normal appearance.   HENT:      Head: Normocephalic and atraumatic.   Eyes:      Extraocular Movements: Extraocular movements intact.   Cardiovascular:      Rate and Rhythm: Normal rate.   Pulmonary:      Effort: Pulmonary effort is normal.   Abdominal:      General: Abdomen is flat.      Palpations: Abdomen is soft. There is no mass.      Tenderness: There is no guarding or rebound.   Genitourinary:     General: Normal vulva.      Exam position: Lithotomy position.      Vagina: Normal.      Cervix: Normal.      Uterus: Normal.       Adnexa: Right adnexa normal and left adnexa normal.   Skin:     General: Skin is warm and dry.   Neurological:      Mental Status: She is alert. Mental status is at baseline.   Psychiatric:         Mood and Affect: Mood normal.          Behavior: Behavior normal.             Assessment:       (N39.0) Urinary tract infection without hematuria, site unspecified  (primary encounter diagnosis)  Plan: POCT UA Dipstix (10)(Multi-Test)    (N89.8) Vaginal dryness    Plan:      Procedures  No orders of the defined types were placed in this encounter.      -UTI sxs resolved, urine dip neg in office - no additional follow up needed  -Discussed that we do not routinely check estrogen levels, especially for vaginal dryness. Also as she is premenopausal with regular menses, there are not strict guidelines for normal values. Pt voiced understanding. Discussed using vaginal lubricants/moisturizers, recommendations given.  -UTD on annual and pap, return next year for annual      [facility], DO 03/05/2022 1:40 PM

## 2022-03-05 NOTE — Patient Instructions (Signed)
Vaginal moisturizers and lubricants    Revaree  over the counter hyaluronic acid for treatment of vaginal dryness.  [employer] is the company who makes     Product (manufacturer) Ingredients Notes   Moisturizers   Replens Water, carbomer, polycarbophil, paraffin, hydrogenated palm oil, glyceride, sorbic acid, and sodium hydroxide Should be used 3 times weekly   Me AgainT Water, carbomer, aloe, citric acid, chlorhexidine deglutinate, sodium benzoate, potassium sorbate, diazolidinyl urea, and sorbic acid     Vagisil Feminine Moisturizer Water, glycerin, propylene glycol, poloxamer 407, methylparaben, polyquaternium-32, propylparaben, chamomile, and aloe     Feminease Water, mineral oil, glycerin, yerba santa, cetyl alcohol, and methyl paraben Yerba santa (Eriodictyon spp), a plant native to the [place], is used as a moisturizer in place of aloe   K-Y SILK-E Water, propylene glycol, sorbitol, polysorbate 60, hydroxyethylcellulose, benzoic acid, methylparaben, tocopherol, and aloe     Lubricants   Water-based   Slippery Stuff Water, polyoxyethylene, methylparaben, propylene glycol, isopropynol     Astroglide Water, glycerin, methylparaben, propylparaben, polypropylene glycol, polyquaternium, hydroxyethylcellulose, and sodium benzoate Also sold in a glycerin-free and paraben-free formulation   K-Y Jelly Water, glycerin, hydroxyethylcellulose, parabens, and chlorhexidine     Pre-Seed Water, hydroxyethylcellulose, arabinogalactan, paraben, and Pluronic copolymers Promoted to women and their partners who are trying to conceive   Silicone-based   ID Millennium Cyclomethicone, dimethicone, and dimethiconol Less drying than other lubricants   Pjur Eros Cyclopentasiloxane, dimethicone, and dimethiconol Compatible with a condom   PinkT Dimethicone, vitamin E, aloe vera, dimethiconol, and cyclomethicone Compatible with a condom   Oil-based   Elgance Women's Lubricant Natural oils Does not contain alcohol,  glycerin, or parabens; is not compatible with a condom

## 2023-01-30 ENCOUNTER — Emergency Department
Admission: EM | Admit: 2023-01-30 | Discharge: 2023-01-30 | Disposition: A | Discharge status: Home / Self Care | Payer: Self-pay | Attending: Student in an Organized Health Care Education/Training Program | Admitting: Student in an Organized Health Care Education/Training Program

## 2023-01-30 DIAGNOSIS — M5431 Sciatica, right side: Secondary | ICD-10-CM

## 2023-01-30 DIAGNOSIS — M5441 Lumbago with sciatica, right side: Secondary | ICD-10-CM | POA: Insufficient documentation

## 2023-01-30 LAB — URINALYSIS WITH REFLEX TO MICROSCOPIC EXAM - REFLEX TO CULTURE
Urine Bilirubin: NEGATIVE
Urine Blood: NEGATIVE
Urine Glucose: NEGATIVE
Urine Ketones: NEGATIVE mg/dL
Urine Leukocyte Esterase: NEGATIVE
Urine Nitrite: NEGATIVE
Urine Specific Gravity: 1.03 (ref 1.001–1.035)
Urine Urobilinogen: NORMAL mg/dL (ref 0.2–2.0)
Urine pH: 6 (ref 5.0–8.0)

## 2023-01-30 LAB — LAB USE ONLY - URINE GRAY CULTURE HOLD TUBE

## 2023-01-30 MED ORDER — PREDNISONE 20 MG PO TABS
40.0000 mg | ORAL_TABLET | Freq: Every day | ORAL | 0 refills | Status: AC
Start: 2023-01-30 — End: 2023-02-04

## 2023-01-30 MED ORDER — METHOCARBAMOL 500 MG PO TABS
500.0000 mg | ORAL_TABLET | Freq: Three times a day (TID) | ORAL | 0 refills | Status: AC
Start: 2023-01-30 — End: 2023-02-04

## 2023-01-30 MED ORDER — DEXAMETHASONE SODIUM PHOSPHATE 4 MG/ML IJ SOLN (WRAP)
8.0000 mg | Freq: Once | INTRAMUSCULAR | Status: AC
Start: 2023-01-30 — End: 2023-01-30
  Administered 2023-01-30: 8 mg via INTRAMUSCULAR
  Filled 2023-01-30 (×2): qty 2

## 2023-01-30 MED ORDER — KETOROLAC TROMETHAMINE 30 MG/ML IJ SOLN
30.0000 mg | Freq: Once | INTRAMUSCULAR | Status: AC
Start: 2023-01-30 — End: 2023-01-30
  Administered 2023-01-30: 30 mg via INTRAMUSCULAR
  Filled 2023-01-30: qty 1

## 2023-01-30 NOTE — Discharge Instructions (Addendum)
You can alternate between 600 mg of ibuprofen every 6 hours and 1000 mg of tylenol every 8 hours  You can take the robaxin as needed for muscle pain and spasm  You should take the steroid as prescribed over the next 5 days to help with pain and inflammation   You should follow up with your PCP, they may recommend physical therapy  Return for worsening symptoms

## 2023-01-30 NOTE — ED Provider Notes (Signed)
 [facility]       [facility]  EMERGENCY DEPARTMENT HISTORY AND PHYSICAL EXAM    Patient Information     Patient Name: Amber Hughes, Amber Hughes  Encounter Date:  01/30/2023  Patient DOB:  December 16, 1990  MRN:  [id number]  Room:  10/A10  Rendering : Don Broach, PA-C    History of Presenting Illness     Chief Complaint: back pain  Historian: Patient      HPI Comments:   32 y.o. female with no pertinent PMH who presents to the ED today with right low back pain x2 days. Pt endorses that she has been experiencing a nearly constant pain in her right low back that radiates down the back of the right leg. She states that this pain is worse with walking. Notably she does do a lot of sitting for work. She denies any prior back injuries or surgeries. No recent trauma or falls. She denies red flag signs and symptoms of low back pain including saddle anesthesia, numbness in extremities, loss of bowel or bladder control, personal hx of cancer.       PMD: Pcp, None, MD    Past Medical History     Past Medical History:   Diagnosis Date    History of chlamydia        Past Surgical History     Past Surgical History[1]    Family History     Family History[2]    Social History     Social History     Socioeconomic History    Marital status: Single     Spouse name: Not on file    Number of children: Not on file    Years of education: Not on file    Highest education level: Not on file   Occupational History    Not on file   Tobacco Use    Smoking status: Former     Current packs/day: 0.00     Types: Cigarettes     Quit date: 2021     Years since quitting: 3.7    Smokeless tobacco: Never   Vaping Use    Vaping status: Never Used   Substance and Sexual Activity    Alcohol use: Yes     Comment: socially    Drug use: Never    Sexual activity: Yes     Partners: Male     Birth control/protection: Condom   Other Topics Concern    Not on file   Social History Narrative    Not on file     Social Determinants of  Health     Financial Resource Strain: Medium Risk (08/07/2021)    Overall Financial Resource Strain (CARDIA)     Difficulty of Paying Living Expenses: Somewhat hard   Food Insecurity: Unknown (08/07/2021)    Hunger Vital Sign     Worried About Running Out of Food in the Last Year: Never true     Ran Out of Food in the Last Year: Not on file   Transportation Needs: Unknown (08/07/2021)    PRAPARE - Therapist, art (Medical): No     Lack of Transportation (Non-Medical): Not on file   Physical Activity: Insufficiently Active (08/07/2021)    Exercise Vital Sign     Days of Exercise per Week: 2 days     Minutes of Exercise per Session: 30 min   Stress: Stress Concern Present (08/07/2021)    [employer] of Occupational  Health - Occupational Stress Questionnaire     Feeling of Stress : Very much   Social Connections: Moderately Isolated (08/07/2021)    Social Connection and Isolation Panel [NHANES]     Frequency of Communication with Friends and Family: More than three times a week     Frequency of Social Gatherings with Friends and Family: Not on file     Attends Religious Services: 1 to 4 times per year     Active Member of [employer] or Organizations: No     Attends Banker Meetings: Never     Marital Status: Never married   Intimate Partner Violence: Not At Risk (08/07/2021)    Humiliation, Afraid, Rape, and Kick questionnaire     Fear of Current or Ex-Partner: No     Emotionally Abused: No     Physically Abused: No     Sexually Abused: No   Housing Stability: High Risk (08/07/2021)    Housing Stability Vital Sign     Unable to Pay for Housing in the Last Year: Yes     Number of Places Lived in the Last Year: Not on file     Unstable Housing in the Last Year: Not on file       Allergies     Allergies[3]    Home Medications     Prior to Admission medications    Medication Sig Start Date End Date Taking? Authorizing    diazePAM (VALIUM) 5 MG tablet Take 1 tablet (5 mg total) by  mouth every 6 (six) hours as needed (muscle spasm) 07/06/19   Nigel Sloop, PA   ibuprofen (ADVIL) 600 MG tablet Take 1 tablet (600 mg total) by mouth every 6 (six) hours as needed for Pain 07/06/19   Deborah Chalk A, PA   lidocaine (LIDODERM) 5 % Place 1 patch onto the skin every 24 hours Remove & Discard patch within 12 hours or as directed by MD 09/29/21   Rulon Abide, MD   Macrobid 100 MG capsule 1 capsule by mouth twice a day for 5 day -  Patient not taking: Reported on 03/05/2022 01/31/22   , Historical, MD         Review of Systems     Please refer to HPI for detailed ROS.    Physical Exam     No data found.      Physical Exam   Nursing note and vitals reviewed.   Constitutional: Pt is oriented to person, place, and time. Pt appears well-developed and well-nourished. Pt resting comfortably in bed. No distress.   HENT:     Head: Normocephalic and atraumatic.     Right Ear: External ear normal.     Left Ear: External ear normal.     Mouth/Throat: MMM. Normal speech.   Eyes: Conjunctivae normal. No drainage. EOMI.  Neck: Normal range of motion.   Cardiovascular: Regular rate and rhythm. No murmur.    Pulmonary/Chest: Vesicular breath sounds. No respiratory distress. O2 sat 99% on RA.   MSK: No gross deformity to the right lower extremity. Neurovascularly intact. +Straight leg test on the right. Sensation intact. Pt able to ambulate. No midline spinal tenderness to palpation, no rashes or skin changes. No crepitus or step off appreciated.   Neurological: Pt is alert and oriented to person, place, and time. GCS 15.  Skin: Skin is warm and dry. Normal skin turgor.   Psychiatric: Normal mood and affect. Behavior is normal.  Orders Placed During This Encounter     Orders Placed This Encounter   Procedures    Urinalysis with Reflex to Microscopic Exam and Culture    Urine Wallace Cullens Culture Hold Tube       ED Medications Administered     ED Medication Orders (From admission, onward)      Start Ordered      Status Ordering     01/30/23 0331 01/30/23 0330  ketorolac (TORADOL) injection 30 mg  Once        Route: Intramuscular  Ordered Dose: 30 mg       Last MAR action: Given  ,  T    01/30/23 0331 01/30/23 0330  dexAMETHasone (DECADRON) injection 8 mg  Once        Route: Intramuscular  Ordered Dose: 8 mg       Last MAR action: Given  ,  T            Diagnostic Study Results and Data Review     The results of the diagnostic studies below were reviewed by the ED :    Labs  Results       Procedure Component Value Units Date/Time    Urinalysis with Reflex to Microscopic Exam and Culture [[id number]]  (Abnormal) Collected: 01/30/23 0242    Specimen: Urine, Clean Catch Updated: 01/30/23 0255     Urine Color Straw     Urine Clarity Clear     Urine Specific Gravity 1.030     Urine pH 6.0     Urine Leukocyte Esterase Negative     Urine Nitrite Negative     Urine Protein 10= Trace     Urine Glucose Negative     Urine Ketones Negative mg/dL      Urine Urobilinogen Normal mg/dL      Urine Bilirubin Negative     Urine Blood Negative     RBC, UA 0-2 /hpf      Urine WBC 0-5 /hpf      Urine Squamous Epithelial Cells 0-5 /hpf      Urine Mucus Present            Radiologic Studies  Radiology Results (24 Hour)       ** No results found for the last 24 hours. **              Abnormal results/incidental findings discussed with pt and/or family: Yes      MDM and Clinical Notes     Working Differential (not completely inclusive): sciatica, lumbar radiculopathy, cauda equina, paraspinal abscess, mass, vertebral fracture etc    Nursing records reviewed and agree: Yes     Clinical Notes:       Medical Decision Making  This is a 32 y.o female with no pertinent PMH who presents today with low back pain x2 days. On exam she is afebrile and hemodynamically stable. Her lower extremity exam shows no gross deformity to the right lower extremity. Neurovascularly intact. +Straight leg test on the right. Sensation  intact. Pt able to ambulate. No midline spinal tenderness to palpation, no rashes or skin changes. No crepitus or step off appreciated. Her exam is consistent with sciatica, I doubt cauda equina or paraspinal abscess or mass based on physical exam and timeline of her symptoms. Dicussed this with her that we should trial steroid and toradol for her symptoms and if improved that will plan to discharge. I discussed following up with PCP for other management options of sciatica  including possible physical therapy. I discussed 600 mg motrin every 6 hours as needed and 1000 mg of tylenol every 8 hours as needed for pain, lidocaine patches as needed for pain as well as heat and stretching. Return precautions discussed including red flag signs and symptoms of back pain. Pt agreeable to plan.     Risk  Prescription drug management.            Re-Eval: Re-eval at     Consult Conversation: n/a    Personal Protective Equipment:  I was wearing appropriate personal protective equipment at the time of patient evaluation and for all face-to-face interactions:     Prescriptions       Discharge Medication List as of 01/30/2023  4:46 AM        START taking these medications    Details   methocarbamol (ROBAXIN) 500 MG tablet Take 1 tablet (500 mg) by mouth 3 (three) times daily for 15 doses, Starting Thu 01/30/2023, Until Tue 02/04/2023, E-Rx      predniSONE (DELTASONE) 20 MG tablet Take 2 tablets (40 mg) by mouth daily for 5 days, Starting Thu 01/30/2023, Until Tue 02/04/2023, E-Rx             Diagnosis and Disposition     Clinical Impression:  1. Sciatica of right side      Final diagnoses:   Sciatica of right side       Disposition:  ED Disposition       ED Disposition   Discharge    Condition   --    Date/Time   Thu Jan 30, 2023  4:31 AM    Comment   Allen Norris discharge to home/self care.    Condition at disposition: Stable                       Rendering : Don Broach, PA-C    Attending's signature signifies review of the   note and clinical impression.      This note was generated by the [facility] EMR system/Dragon speech recognition and may contain inherent errors or omissions not intended by the user. Grammatical errors, random word insertions, deletions, pronoun errors and incomplete sentences are occasional consequences of this technology due to software limitations. Not all errors are caught or corrected. If there are questions or concerns about the content of this note or information contained within the body of this dictation they should be addressed directly with the author for clarification.             [1]   Past Surgical History:  Procedure Laterality Date    CESAREAN SECTION      x3   [2]   Family History  Problem Relation Name Age of Onset    Diabetes Mother      Hypertension Mother      Diabetes Father      Hypertension Father      Breast cancer Maternal Aunt  25    Colon cancer Neg Hx      Ovarian cancer Neg Hx     [3] No Known Allergies       Jearld Shines, PA  02/04/23 1119
